# Patient Record
Sex: Male | Born: 1985 | Race: White | Hispanic: No | Marital: Married | Smoking: Former smoker
Health system: Southern US, Community
[De-identification: ages and names within clinical notes are randomized; demographics above are authoritative.]

## PROBLEM LIST (undated history)

## (undated) DIAGNOSIS — Z8489 Family history of other specified conditions: Secondary | ICD-10-CM

## (undated) DIAGNOSIS — T4145XA Adverse effect of unspecified anesthetic, initial encounter: Secondary | ICD-10-CM

## (undated) DIAGNOSIS — R55 Syncope and collapse: Secondary | ICD-10-CM

## (undated) DIAGNOSIS — R569 Unspecified convulsions: Secondary | ICD-10-CM

## (undated) DIAGNOSIS — R51 Headache: Secondary | ICD-10-CM

## (undated) DIAGNOSIS — F988 Other specified behavioral and emotional disorders with onset usually occurring in childhood and adolescence: Secondary | ICD-10-CM

## (undated) DIAGNOSIS — T8859XA Other complications of anesthesia, initial encounter: Secondary | ICD-10-CM

## (undated) DIAGNOSIS — R519 Headache, unspecified: Secondary | ICD-10-CM

## (undated) HISTORY — DX: Other specified behavioral and emotional disorders with onset usually occurring in childhood and adolescence: F98.8

## (undated) HISTORY — PX: HERNIA REPAIR: SHX51

---
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
## 2007-03-09 ENCOUNTER — Ambulatory Visit: Payer: Self-pay | Admitting: Internal Medicine

## 2007-03-09 DIAGNOSIS — L708 Other acne: Secondary | ICD-10-CM | POA: Insufficient documentation

## 2007-03-11 ENCOUNTER — Encounter (INDEPENDENT_AMBULATORY_CARE_PROVIDER_SITE_OTHER): Payer: Self-pay | Admitting: *Deleted

## 2007-03-11 LAB — CONVERTED CEMR LAB
ALT: 18 units/L (ref 0–53)
AST: 21 units/L (ref 0–37)
BUN: 11 mg/dL (ref 6–23)
Basophils Absolute: 0 10*3/uL (ref 0.0–0.1)
Basophils Relative: 0.1 % (ref 0.0–1.0)
CO2: 33 meq/L — ABNORMAL HIGH (ref 19–32)
Calcium: 9.6 mg/dL (ref 8.4–10.5)
Chloride: 104 meq/L (ref 96–112)
Cholesterol: 127 mg/dL (ref 0–200)
Creatinine, Ser: 0.9 mg/dL (ref 0.4–1.5)
Eosinophils Absolute: 0.1 10*3/uL (ref 0.0–0.6)
Eosinophils Relative: 0.9 % (ref 0.0–5.0)
GFR calc Af Amer: 140 mL/min
GFR calc non Af Amer: 116 mL/min
Glucose, Bld: 88 mg/dL (ref 70–99)
HCT: 45.5 % (ref 39.0–52.0)
Hemoglobin: 15.9 g/dL (ref 13.0–17.0)
Lymphocytes Relative: 21.6 % (ref 12.0–46.0)
MCHC: 34.9 g/dL (ref 30.0–36.0)
MCV: 86.6 fL (ref 78.0–100.0)
Monocytes Absolute: 0.6 10*3/uL (ref 0.2–0.7)
Monocytes Relative: 8.7 % (ref 3.0–11.0)
Neutro Abs: 4.6 10*3/uL (ref 1.4–7.7)
Neutrophils Relative %: 68.7 % (ref 43.0–77.0)
Platelets: 208 10*3/uL (ref 150–400)
Potassium: 4.3 meq/L (ref 3.5–5.1)
RBC: 5.25 M/uL (ref 4.22–5.81)
RDW: 12.4 % (ref 11.5–14.6)
Sodium: 142 meq/L (ref 135–145)
TSH: 0.9 microintl units/mL (ref 0.35–5.50)
WBC: 6.7 10*3/uL (ref 4.5–10.5)

## 2007-03-24 ENCOUNTER — Emergency Department (HOSPITAL_COMMUNITY): Admission: EM | Admit: 2007-03-24 | Discharge: 2007-03-24 | Payer: Self-pay | Admitting: Emergency Medicine

## 2008-01-21 ENCOUNTER — Telehealth (INDEPENDENT_AMBULATORY_CARE_PROVIDER_SITE_OTHER): Payer: Self-pay | Admitting: *Deleted

## 2008-01-26 ENCOUNTER — Ambulatory Visit: Payer: Self-pay | Admitting: Internal Medicine

## 2008-11-27 ENCOUNTER — Emergency Department (HOSPITAL_COMMUNITY): Admission: EM | Admit: 2008-11-27 | Discharge: 2008-11-27 | Payer: Self-pay | Admitting: Family Medicine

## 2009-07-07 ENCOUNTER — Emergency Department (HOSPITAL_COMMUNITY): Admission: EM | Admit: 2009-07-07 | Discharge: 2009-07-07 | Payer: Self-pay | Admitting: Emergency Medicine

## 2009-11-13 ENCOUNTER — Emergency Department (HOSPITAL_COMMUNITY): Admission: EM | Admit: 2009-11-13 | Discharge: 2009-11-13 | Payer: Self-pay | Admitting: Family Medicine

## 2009-12-01 ENCOUNTER — Emergency Department (HOSPITAL_COMMUNITY): Admission: EM | Admit: 2009-12-01 | Discharge: 2009-12-01 | Payer: Self-pay | Admitting: Emergency Medicine

## 2010-08-03 LAB — HEPATIC FUNCTION PANEL
AST: 19 U/L (ref 0–37)
Bilirubin, Direct: 0.3 mg/dL (ref 0.0–0.3)
Indirect Bilirubin: 1.4 mg/dL — ABNORMAL HIGH (ref 0.3–0.9)
Total Protein: 7.2 g/dL (ref 6.0–8.3)

## 2010-08-03 LAB — URINALYSIS, ROUTINE W REFLEX MICROSCOPIC
Bilirubin Urine: NEGATIVE
Glucose, UA: NEGATIVE mg/dL
Hgb urine dipstick: NEGATIVE
Ketones, ur: 15 mg/dL — AB
Nitrite: NEGATIVE
Protein, ur: NEGATIVE mg/dL
Specific Gravity, Urine: >1.03 — ABNORMAL HIGH (ref 1.005–1.030)
Urobilinogen, UA: 0.2 mg/dL (ref 0.0–1.0)
pH: 6.5 (ref 5.0–8.0)

## 2010-08-03 LAB — DIFFERENTIAL
Basophils Absolute: 0 10*3/uL (ref 0.0–0.1)
Basophils Relative: 0 % (ref 0–1)
Monocytes Absolute: 0.5 10*3/uL (ref 0.1–1.0)
Neutro Abs: 6.1 10*3/uL (ref 1.7–7.7)

## 2010-08-03 LAB — BASIC METABOLIC PANEL
BUN: 8 mg/dL (ref 6–23)
Calcium: 9.3 mg/dL (ref 8.4–10.5)
Creatinine, Ser: 0.91 mg/dL (ref 0.4–1.5)
GFR calc non Af Amer: >60 mL/min (ref >60)
Glucose, Bld: 82 mg/dL (ref 70–99)
Potassium: 3.9 mEq/L (ref 3.5–5.1)

## 2010-08-03 LAB — CBC
HCT: 43.1 % (ref 39.0–52.0)
MCHC: 34.5 g/dL (ref 30.0–36.0)
Platelets: 173 10*3/uL (ref 150–400)
RDW: 12.8 % (ref 11.5–15.5)
WBC: 8 10*3/uL (ref 4.0–10.5)

## 2010-08-08 LAB — CBC
Hemoglobin: 15.2 g/dL (ref 13.0–17.0)
RBC: 5.1 MIL/uL (ref 4.22–5.81)

## 2010-08-08 LAB — BASIC METABOLIC PANEL
Calcium: 9.4 mg/dL (ref 8.4–10.5)
GFR calc Af Amer: >60 mL/min (ref >60)
GFR calc non Af Amer: >60 mL/min (ref >60)
Potassium: 3.9 mEq/L (ref 3.5–5.1)
Sodium: 137 mEq/L (ref 135–145)

## 2010-08-08 LAB — DIFFERENTIAL
Lymphocytes Relative: 21 % (ref 12–46)
Lymphs Abs: 1.5 10*3/uL (ref 0.7–4.0)
Monocytes Absolute: 0.5 10*3/uL (ref 0.1–1.0)
Monocytes Relative: 7 % (ref 3–12)
Neutro Abs: 4.9 10*3/uL (ref 1.7–7.7)

## 2010-09-03 ENCOUNTER — Encounter: Payer: Self-pay | Admitting: Family Medicine

## 2010-09-17 ENCOUNTER — Emergency Department (HOSPITAL_COMMUNITY): Payer: 59

## 2010-09-17 ENCOUNTER — Emergency Department (HOSPITAL_COMMUNITY)
Admission: EM | Admit: 2010-09-17 | Discharge: 2010-09-17 | Disposition: A | Discharge status: Home / Self Care | Payer: 59 | Attending: Emergency Medicine | Admitting: Emergency Medicine

## 2010-09-17 DIAGNOSIS — F988 Other specified behavioral and emotional disorders with onset usually occurring in childhood and adolescence: Secondary | ICD-10-CM | POA: Insufficient documentation

## 2010-09-17 DIAGNOSIS — W219XXA Striking against or struck by unspecified sports equipment, initial encounter: Secondary | ICD-10-CM | POA: Insufficient documentation

## 2010-09-17 DIAGNOSIS — Y929 Unspecified place or not applicable: Secondary | ICD-10-CM | POA: Insufficient documentation

## 2010-09-17 DIAGNOSIS — Y9364 Activity, baseball: Secondary | ICD-10-CM | POA: Insufficient documentation

## 2010-09-17 DIAGNOSIS — R079 Chest pain, unspecified: Secondary | ICD-10-CM | POA: Insufficient documentation

## 2010-09-17 DIAGNOSIS — S20219A Contusion of unspecified front wall of thorax, initial encounter: Secondary | ICD-10-CM | POA: Insufficient documentation

## 2010-09-17 DIAGNOSIS — Z79899 Other long term (current) drug therapy: Secondary | ICD-10-CM | POA: Insufficient documentation

## 2011-12-27 ENCOUNTER — Emergency Department (HOSPITAL_COMMUNITY)
Admission: EM | Admit: 2011-12-27 | Discharge: 2011-12-27 | Disposition: A | Discharge status: Home / Self Care | Payer: BLUE CROSS/BLUE SHIELD | Source: Home / Self Care | Attending: Emergency Medicine | Admitting: Emergency Medicine

## 2011-12-27 ENCOUNTER — Emergency Department (INDEPENDENT_AMBULATORY_CARE_PROVIDER_SITE_OTHER): Payer: BLUE CROSS/BLUE SHIELD

## 2011-12-27 ENCOUNTER — Encounter (HOSPITAL_COMMUNITY): Payer: Self-pay | Admitting: *Deleted

## 2011-12-27 DIAGNOSIS — J189 Pneumonia, unspecified organism: Secondary | ICD-10-CM

## 2011-12-27 MED ORDER — AZITHROMYCIN 250 MG PO TABS
ORAL_TABLET | ORAL | Status: AC
  Filled 2011-12-27: qty 4

## 2011-12-27 MED ORDER — DOXYCYCLINE HYCLATE 100 MG PO CAPS: 100.0000 mg | ORAL_CAPSULE | Freq: Two times a day (BID) | ORAL | Status: AC

## 2011-12-27 MED ORDER — GUAIFENESIN-CODEINE 100-10 MG/5ML PO SYRP: 5.0000 mL | ORAL_SOLUTION | Freq: Four times a day (QID) | ORAL | Status: AC | PRN

## 2011-12-27 MED ORDER — DOXYCYCLINE HYCLATE 100 MG PO CAPS: 100.0000 mg | ORAL_CAPSULE | Freq: Two times a day (BID) | ORAL | Status: DC

## 2011-12-27 MED ORDER — CEFTRIAXONE SODIUM 1 G IJ SOLR
1.0000 g | Freq: Once | INTRAMUSCULAR | Status: AC
  Administered 2011-12-27: 1 g via INTRAMUSCULAR

## 2011-12-27 MED ORDER — CEFTRIAXONE SODIUM 1 G IJ SOLR
INTRAMUSCULAR | Status: AC
  Filled 2011-12-27: qty 10

## 2011-12-27 MED ORDER — AZITHROMYCIN 250 MG PO TABS
1000.0000 mg | ORAL_TABLET | Freq: Every day | ORAL | Status: DC
  Administered 2011-12-27: 1000 mg via ORAL

## 2011-12-27 MED ORDER — LIDOCAINE HCL (PF) 1 % IJ SOLN
INTRAMUSCULAR | Status: AC
  Filled 2011-12-27: qty 5

## 2011-12-27 NOTE — ED Notes (Signed)
Pt with c/o congestion and cough x one week - generalized bodyaches x 2 days and fever onset yesterday -

## 2011-12-27 NOTE — ED Provider Notes (Addendum)
History     CSN: [id number]  Arrival date & time 12/27/11  1528   First MD Initiated Contact with Patient 12/27/11 1602      No chief complaint on file.   (Consider location/radiation/quality/duration/timing/severity/associated sxs/prior treatment) HPI Comments: Patient presents urgent care this evening complaining of an ongoing raspy cough with generalized body aches including major joints in his lower back. Also describes having initially a sore throat and a runny nose as slowly progressed throughout the days to a sinus congestion, " it even hurts just been head forward", are 26-year-old child has a double ear infection and is being treated for it. The last 2 days have been feeling worse and has been a bit more than a week now he should have been getting better but is actually getting worse. Feel tired and no appetite. Patient denies any abdominal pain, vomiting or diarrhea as. Been taking over-the-counter decongestants. Tylenol. Denies any shortness of breath and admits that he is a chronic and current smoker.  The history is provided by the patient.    Past Medical History  Diagnosis Date  . ADD (attention deficit disorder)     No past surgical history on file.  Family History  Problem Relation Age of Onset  . Diabetes Paternal Grandmother   . Hypertension Paternal Grandmother     History  Substance Use Topics  . Smoking status: Current Everyday Smoker -- 1.0 packs/day  . Smokeless tobacco: Not on file  . Alcohol Use: No      Review of Systems  Constitutional: Positive for fever, chills, activity change and appetite change. Negative for fatigue.  HENT: Positive for congestion, rhinorrhea, postnasal drip and sinus pressure. Negative for ear pain, neck pain and neck stiffness.   Respiratory: Positive for cough. Negative for apnea, chest tightness, shortness of breath and wheezing.   Cardiovascular: Negative for chest pain, palpitations and leg swelling.  Genitourinary:  Negative for frequency and flank pain.  Musculoskeletal: Positive for myalgias and arthralgias.  Skin: Negative for rash.  Neurological: Positive for dizziness.    Allergies  Amoxicillin  Home Medications   Current Outpatient Rx  Name Route Sig Dispense Refill  . AMPHETAMINE-DEXTROAMPHET ER 20 MG PO CP24 Oral Take 20 mg by mouth every morning. Rx#30       BP 120/78  Pulse 105  Temp 101 F (38.3 C) (Oral)  Resp 18  SpO2 100%  Physical Exam  Nursing note and vitals reviewed. Constitutional: He appears well-developed and well-nourished. He is active.  Non-toxic appearance. He does not have a sickly appearance. He appears ill. No distress.  HENT:  Head: Normocephalic.  Nose: Rhinorrhea present.  Mouth/Throat: Uvula is midline and mucous membranes are normal. Posterior oropharyngeal erythema present. No oropharyngeal exudate.  Eyes: Conjunctivae are normal.  Neck: Neck supple. No JVD present.  Pulmonary/Chest: Effort normal. No respiratory distress. He has no wheezes. He has no rales. He exhibits no tenderness.  Lymphadenopathy:    He has no cervical adenopathy.  Neurological: He is alert.  Skin: Skin is warm. No rash noted. No erythema.    ED Course  Procedures (including critical care time)  Labs Reviewed - No data to display No results found.   No diagnosis found.    MDM   Early patchy learnt pneumonia. Patient has been provider with a ceftriaxone injection of her stools of a microwave. Her broad-spectrum an atypical coverage. This has been prescribed a course of doxycycline for 10 days. Otherwise about symptoms that should warrant  further evaluation. Patient had a provider with Dr. San Jetty 72 hours and encouraged to breast and hydrate himself well for the next 72 hours. We also discussed short and long-term consequences of smoking or       Jimmie Molly, MD 12/27/11 1634  Jimmie Molly, MD 12/27/11 [phone]

## 2012-03-19 NOTE — Patient Instructions (Signed)
Kurt Bishop  03/19/2012   Your procedure is scheduled on:  03/24/12  Report to [name].  Call this number if you have problems the morning of surgery: [phone]   Remember:   Do not eat food:After Midnight.  May have clear liquids:until Midnight .  Clear liquids include soda, tea, black coffee, apple or grape juice, broth.  Take these medicines the morning of surgery with A SIP OF WATER: adderall, vyvanse   Do not wear jewelry, make-up or nail polish.  Do not wear lotions, powders, or perfumes. You may wear deodorant.  Do not shave 48 hours prior to surgery. Men may shave face and neck.  Do not bring valuables to the hospital.  Contacts, dentures or bridgework may not be worn into surgery.  Leave suitcase in the car. After surgery it may be brought to your room.  For patients admitted to the hospital, checkout time is 11:00 AM the day of discharge.   Patients discharged the day of surgery will not be allowed to drive home.  Name and phone number of your driver: family  Special Instructions: Shower using CHG 2 nights before surgery and the night before surgery.  If you shower the day of surgery use CHG.  Use special wash - you have one bottle of CHG for all showers.  You should use approximately 1/3 of the bottle for each shower.   Please read over the following fact sheets that you were given: Pain Booklet, MRSA Information, Surgical Site Infection Prevention, Anesthesia Post-op Instructions and Care and Recovery After Surgery   PATIENT INSTRUCTIONS POST-ANESTHESIA  IMMEDIATELY FOLLOWING SURGERY:  Do not drive or operate machinery for the first twenty four hours after surgery.  Do not make any important decisions for twenty four hours after surgery or while taking narcotic pain medications or sedatives.  If you develop intractable nausea and vomiting or a severe headache please notify your doctor immediately.  FOLLOW-UP:  Please make an appointment with your  surgeon as instructed. You do not need to follow up with anesthesia unless specifically instructed to do so.  WOUND CARE INSTRUCTIONS (if applicable):  Keep a dry clean dressing on the anesthesia/puncture wound site if there is drainage.  Once the wound has quit draining you may leave it open to air.  Generally you should leave the bandage intact for twenty four hours unless there is drainage.  If the epidural site drains for more than 36-48 hours please call the anesthesia department.  QUESTIONS?:  Please feel free to call your physician or the hospital operator if you have any questions, and they will be happy to assist you.      Hernia, Surgical Repair A hernia occurs when an internal organ pushes out through a weak spot in the belly (abdominal) wall muscles. Hernias commonly occur in the groin and around the navel. Hernias often can be pushed back into place (reduced). Most hernias tend to get worse over time. Problems occur when abdominal contents get stuck in the opening (incarcerated hernia). The blood supply gets cut off (strangulated hernia). This is an emergency and needs surgery. Otherwise, hernia repair can be an elective procedure. This means you can schedule this at your convenience when an emergency is not present. Because complications can occur, if you decide to repair the hernia, it is best to do it soon. When it becomes an emergency procedure, there is increased risk of complications after surgery. CAUSES   Heavy lifting.  Obesity.  Prolonged coughing.  Straining to move your bowels.  Hernias can also occur through a cut (incision) by a surgeonafter an abdominal operation. HOME CARE INSTRUCTIONS Before the repair:  Bed rest is not required. You may continue your normal activities, but avoid heavy lifting (more than 10 pounds) or straining. Cough gently. If you are a smoker, it is best to stop. Even the best hernia repair can break down with the continual strain of  coughing.  Do not wear anything tight over your hernia. Do not try to keep it in with an outside bandage or truss. These can damage abdominal contents if they are trapped in the hernia sac.  Eat a normal diet. Avoid constipation. Straining over long periods of time to have a bowel movement will increase hernia size. It also can breakdown repairs. If you cannot do this with diet alone, laxatives or stool softeners may be used. PRIOR TO SURGERY, SEEK IMMEDIATE MEDICAL CARE IF: You have problems (symptoms) of a trapped (incarcerated) hernia. Symptoms include:  An oral temperature above 102 F (38.9 C) develops, or as your caregiver suggests.  Increasing abdominal pain.  Feeling sick to your stomach(nausea) and vomiting.  You stop passing gas or stool.  The hernia is stuck outside the abdomen, looks discolored, feels hard, or is tender.  You have any changes in your bowel habits or in the hernia that is unusual for you. LET YOUR CAREGIVERS KNOW ABOUT THE FOLLOWING:  Allergies.  Medications taken including herbs, eye drops, over the counter medications, and creams.  Use of steroids (by mouth or creams).  Family or personal history of problems with anesthetics or Novocaine.  Possibility of pregnancy, if this applies.  Personal history of blood clots (thrombophlebitis).  Family or personal history of bleeding or blood problems.  Previous surgery.  Other health problems. BEFORE THE PROCEDURE You should be present 1 hour prior to your procedure, or as directed by your caregiver.  AFTER THE PROCEDURE After surgery, you will be taken to the recovery area. A nurse will watch and check your progress there. Once you are awake, stable, and taking fluids well, you will be allowed to go home as long as there are no problems. Once home, an ice pack (wrapped in a light towel) applied to your operative site may help with discomfort. It may also keep the swelling down. Do not lift anything  heavier than 10 pounds (4.55 kilograms). Take showers not baths. Do not drive while taking narcotics. Follow instructions as suggested by your caregiver.  SEEK IMMEDIATE MEDICAL CARE IF: After surgery:  There is redness, swelling, or increasing pain in the wound.  There is pus coming from the wound.  There is drainage from a wound lasting longer than 1 day.  An unexplained oral temperature above 102 F (38.9 C) develops.  You notice a foul smell coming from the wound or dressing.  There is a breaking open of a wound (edged not staying together) after the sutures have been removed.  You notice increasing pain in the shoulders (shoulder strap areas).  You develop dizzy episodes or fainting while standing.  You develop persistent nausea or vomiting.  You develop a rash.  You have difficulty breathing.  You develop any reaction or side effects to medications given. MAKE SURE YOU:   Understand these instructions.  Will watch your condition.  Will get help right away if you are not doing well or get worse. Document Released: 10/29/2000 Document Revised: 07/28/2011 Document Reviewed: 09/21/2007 ExitCare Patient  Information 2013 [place], [place].

## 2012-03-22 ENCOUNTER — Encounter (HOSPITAL_COMMUNITY)
Admission: RE | Admit: 2012-03-22 | Discharge: 2012-03-22 | Disposition: A | Discharge status: Home / Self Care | Payer: BC Managed Care – PPO | Source: Ambulatory Visit | Attending: General Surgery | Admitting: General Surgery

## 2012-03-22 ENCOUNTER — Encounter (HOSPITAL_COMMUNITY): Payer: Self-pay | Admitting: Pharmacy Technician

## 2012-03-22 ENCOUNTER — Encounter (HOSPITAL_COMMUNITY): Payer: Self-pay

## 2012-03-22 LAB — BASIC METABOLIC PANEL
BUN: 13 mg/dL (ref 6–23)
Calcium: 9.8 mg/dL (ref 8.4–10.5)
GFR calc non Af Amer: >90 mL/min (ref >90)
Glucose, Bld: 63 mg/dL — ABNORMAL LOW (ref 70–99)
Potassium: 4.3 mEq/L (ref 3.5–5.1)

## 2012-03-22 LAB — CBC
HCT: 45.9 % (ref 39.0–52.0)
Hemoglobin: 16 g/dL (ref 13.0–17.0)
MCH: 29.7 pg (ref 26.0–34.0)
MCHC: 34.9 g/dL (ref 30.0–36.0)
MCV: 85.2 fL (ref 78.0–100.0)

## 2012-03-23 NOTE — H&P (Signed)
  NTS SOAP Note  Vital Signs:  Vitals as of: 03/18/2012: Systolic 110: Diastolic 74: Heart Rate 86: Temp 98.57F: Height 63ft 11in: Weight 134Lbs 0 Ounces: BMI 19  BMI : 18.69 kg/m2  Subjective: This 26 Years 1 Months old Male presents for of pain in the right groin. This is been increasing over the last several weeks. He has noted occasional bulge in this area. No fevers or chills. No signs or symptoms of incarceration or strangulation. No similar symptomatology in the past.  Review of Symptoms:  Constitutional:unremarkable   Head:unremarkable    Eyes:unremarkable   Nose/Mouth/Throat:unremarkable Cardiovascular:  unremarkable   Respiratory:unremarkable       as per history of present illness Genitourinary:unremarkable     Musculoskeletal:unremarkable   Skin:unremarkable Breast:unremarkable   Hematolgic/Lymphatic:unremarkable     Allergic/Immunologic:unremarkable     Past Medical History:  Obtained     Past Medical History  Surgical History: None Medical Problems: ADHD Psychiatric History: ADHD Allergies: no known drug allergies Medications: vyvance   Social History:Obtained  Social History  Preferred Language: English Ethnicity: Not Hispanic / Latino Age: 26 Years 1 Months Alcohol: none Recreational drug(s): none   Smoking Status: Current every day smoker reviewed on 03/19/2012 Started Date: 05/20/2003 Packs per day: 1.00 Functional Status reviewed on mm/dd/yyyy ------------------------------------------------ Bathing: Normal Cooking: Normal Dressing: Normal Driving: Normal Eating: Normal Managing Meds: Normal Oral Care: Normal Shopping: Normal Toileting: Normal Transferring: Normal Walking: Normal Cognitive Status reviewed on mm/dd/yyyy ------------------------------------------------ Attention: Normal Decision Making: Normal Language: Normal Memory: Normal Motor: Normal Perception: Normal Problem  Solving: Normal Visual and Spatial: Normal   Family History:Obtained     Family History  Is there a family history JX:BJYNWGNF mellitus    Objective Information: General:  Well appearing, well nourished in no distress. Skin:     no rash or prominent lesions Head:Atraumatic; no masses; no abnormalities Eyes:  conjunctiva clear, EOM intact, PERRL Mouth:  Mucous membranes moist, no mucosal lesions. Neck:  Supple without lymphadenopathy.  Heart:  RRR, no murmur Lungs:    CTA bilaterally, no wheezes, rhonchi, rales.  Breathing unlabored. Abdomen:Soft, NT/ND, no HSM, no masses.  right angle hernia, reducible Extremities:  No deformities, clubbing, cyanosis, or edema.   Assessment:    Plan: right inguinal hernia. Surgical options were discussed. Signs and symptoms of incarceration and strangulation were also discussed. patient will schedule at his convenience the patient also expressed interest in vasectomy and stated the patient can be set up peripheral with urology following his hernia repair.  Patient Education:Alternative treatments to surgery were discussed with patient (and family).  Risks and benefits  of procedure were fully explained to the patient (and family) who gave informed consent. Patient/family questions were addressed.  Follow-up:Pending Surgery

## 2012-03-24 ENCOUNTER — Encounter (HOSPITAL_COMMUNITY): Payer: Self-pay | Admitting: Anesthesiology

## 2012-03-24 ENCOUNTER — Ambulatory Visit (HOSPITAL_COMMUNITY): Payer: BC Managed Care – PPO | Admitting: Anesthesiology

## 2012-03-24 ENCOUNTER — Encounter (HOSPITAL_COMMUNITY)
Admission: RE | Disposition: A | Discharge status: Home / Self Care | Payer: Self-pay | Source: Ambulatory Visit | Attending: General Surgery

## 2012-03-24 ENCOUNTER — Ambulatory Visit (HOSPITAL_COMMUNITY)
Admission: RE | Admit: 2012-03-24 | Discharge: 2012-03-24 | Disposition: A | Discharge status: Home / Self Care | Payer: BC Managed Care – PPO | Source: Ambulatory Visit | Attending: General Surgery | Admitting: General Surgery

## 2012-03-24 ENCOUNTER — Encounter (HOSPITAL_COMMUNITY): Payer: Self-pay | Admitting: *Deleted

## 2012-03-24 DIAGNOSIS — Z01812 Encounter for preprocedural laboratory examination: Secondary | ICD-10-CM | POA: Insufficient documentation

## 2012-03-24 DIAGNOSIS — K4090 Unilateral inguinal hernia, without obstruction or gangrene, not specified as recurrent: Secondary | ICD-10-CM | POA: Insufficient documentation

## 2012-03-24 HISTORY — PX: INGUINAL HERNIA REPAIR: SHX194

## 2012-03-24 SURGERY — REPAIR, HERNIA, INGUINAL, ADULT
Anesthesia: General | Site: Groin | Laterality: Right | Wound class: Clean

## 2012-03-24 MED ORDER — SODIUM CHLORIDE 0.9 % IR SOLN
Status: DC | PRN
  Administered 2012-03-24: 1000 mL

## 2012-03-24 MED ORDER — CEFAZOLIN SODIUM-DEXTROSE 2-3 GM-% IV SOLR
INTRAVENOUS | Status: AC
  Filled 2012-03-24: qty 50

## 2012-03-24 MED ORDER — PROPOFOL 10 MG/ML IV EMUL
INTRAVENOUS | Status: AC
  Filled 2012-03-24: qty 20

## 2012-03-24 MED ORDER — ONDANSETRON HCL 4 MG/2ML IJ SOLN: 4.0000 mg | Freq: Once | INTRAMUSCULAR | Status: DC | PRN

## 2012-03-24 MED ORDER — CELECOXIB 100 MG PO CAPS: 400.0000 mg | ORAL_CAPSULE | Freq: Every day | ORAL | Status: DC

## 2012-03-24 MED ORDER — CEFAZOLIN SODIUM-DEXTROSE 2-3 GM-% IV SOLR
2.0000 g | Freq: Once | INTRAVENOUS | Status: AC
  Administered 2012-03-24: 2 g via INTRAVENOUS

## 2012-03-24 MED ORDER — LACTATED RINGERS IV SOLN
INTRAVENOUS | Status: DC
  Administered 2012-03-24: 1000 mL via INTRAVENOUS

## 2012-03-24 MED ORDER — ONDANSETRON HCL 4 MG/2ML IJ SOLN
INTRAMUSCULAR | Status: AC
  Filled 2012-03-24: qty 2

## 2012-03-24 MED ORDER — FENTANYL CITRATE 0.05 MG/ML IJ SOLN
INTRAMUSCULAR | Status: AC
  Filled 2012-03-24: qty 5

## 2012-03-24 MED ORDER — FENTANYL CITRATE 0.05 MG/ML IJ SOLN: 25.0000 ug | INTRAMUSCULAR | Status: DC | PRN

## 2012-03-24 MED ORDER — BUPIVACAINE HCL (PF) 0.5 % IJ SOLN
INTRAMUSCULAR | Status: DC | PRN
  Administered 2012-03-24: 10 mL

## 2012-03-24 MED ORDER — HYDROCODONE-ACETAMINOPHEN 5-325 MG PO TABS: 1.0000 | ORAL_TABLET | ORAL | Status: DC | PRN

## 2012-03-24 MED ORDER — MIDAZOLAM HCL 2 MG/2ML IJ SOLN
1.0000 mg | INTRAMUSCULAR | Status: DC | PRN
  Administered 2012-03-24: 2 mg via INTRAVENOUS

## 2012-03-24 MED ORDER — ONDANSETRON HCL 4 MG/2ML IJ SOLN
4.0000 mg | Freq: Once | INTRAMUSCULAR | Status: AC
  Administered 2012-03-24: 4 mg via INTRAVENOUS

## 2012-03-24 MED ORDER — PROPOFOL 10 MG/ML IV BOLUS
INTRAVENOUS | Status: DC | PRN
  Administered 2012-03-24: 100 mg via INTRAVENOUS
  Administered 2012-03-24: 160 mg via INTRAVENOUS

## 2012-03-24 MED ORDER — BUPIVACAINE HCL (PF) 0.5 % IJ SOLN
INTRAMUSCULAR | Status: AC
  Filled 2012-03-24: qty 30

## 2012-03-24 MED ORDER — LIDOCAINE HCL (PF) 1 % IJ SOLN
INTRAMUSCULAR | Status: AC
  Filled 2012-03-24: qty 5

## 2012-03-24 MED ORDER — FENTANYL CITRATE 0.05 MG/ML IJ SOLN
INTRAMUSCULAR | Status: DC | PRN
  Administered 2012-03-24 (×2): 50 ug via INTRAVENOUS
  Administered 2012-03-24 (×3): 25 ug via INTRAVENOUS
  Administered 2012-03-24: 50 ug via INTRAVENOUS
  Administered 2012-03-24: 25 ug via INTRAVENOUS

## 2012-03-24 MED ORDER — LACTATED RINGERS IV SOLN: INTRAVENOUS | Status: DC

## 2012-03-24 MED ORDER — LIDOCAINE HCL (CARDIAC) 20 MG/ML IV SOLN
INTRAVENOUS | Status: DC | PRN
  Administered 2012-03-24: 40 mg via INTRAVENOUS

## 2012-03-24 MED ORDER — MIDAZOLAM HCL 2 MG/2ML IJ SOLN
INTRAMUSCULAR | Status: AC
  Filled 2012-03-24: qty 2

## 2012-03-24 MED ORDER — MIDAZOLAM HCL 5 MG/5ML IJ SOLN
INTRAMUSCULAR | Status: DC | PRN
  Administered 2012-03-24: 2 mg via INTRAVENOUS

## 2012-03-24 SURGICAL SUPPLY — 40 items
APL SKNCLS STERI-STRIP NONHPOA (GAUZE/BANDAGES/DRESSINGS) ×1
BAG HAMPER (MISCELLANEOUS) ×2 IMPLANT
BENZOIN TINCTURE PRP APPL 2/3 (GAUZE/BANDAGES/DRESSINGS) ×2 IMPLANT
CLOTH BEACON ORANGE TIMEOUT ST (SAFETY) ×2 IMPLANT
COVER LIGHT HANDLE STERIS (MISCELLANEOUS) ×4 IMPLANT
DECANTER SPIKE VIAL GLASS SM (MISCELLANEOUS) ×2 IMPLANT
DRAIN PENROSE 18X.75 LTX STRL (MISCELLANEOUS) ×2 IMPLANT
DURAPREP 26ML APPLICATOR (WOUND CARE) ×2 IMPLANT
ELECT REM PT RETURN 9FT ADLT (ELECTROSURGICAL) ×2
ELECTRODE REM PT RTRN 9FT ADLT (ELECTROSURGICAL) ×1 IMPLANT
FORMALIN 10 PREFIL 120ML (MISCELLANEOUS) ×1 IMPLANT
GLOVE BIOGEL PI IND STRL 7.0 (GLOVE) IMPLANT
GLOVE BIOGEL PI IND STRL 7.5 (GLOVE) ×1 IMPLANT
GLOVE BIOGEL PI INDICATOR 7.0 (GLOVE) ×3
GLOVE BIOGEL PI INDICATOR 7.5 (GLOVE) ×1
GLOVE ECLIPSE 6.5 STRL STRAW (GLOVE) ×2 IMPLANT
GLOVE ECLIPSE 7.0 STRL STRAW (GLOVE) ×2 IMPLANT
GOWN STRL REIN XL XLG (GOWN DISPOSABLE) ×7 IMPLANT
INST SET MINOR GENERAL (KITS) ×2 IMPLANT
KIT ROOM TURNOVER APOR (KITS) ×2 IMPLANT
MANIFOLD NEPTUNE II (INSTRUMENTS) ×2 IMPLANT
MESH MARLEX PLUG MEDIUM (Mesh General) ×1 IMPLANT
NDL HYPO 25X1 1.5 SAFETY (NEEDLE) ×1 IMPLANT
NEEDLE HYPO 25X1 1.5 SAFETY (NEEDLE) ×2 IMPLANT
NS IRRIG 1000ML POUR BTL (IV SOLUTION) ×2 IMPLANT
PACK MINOR (CUSTOM PROCEDURE TRAY) ×2 IMPLANT
PAD ARMBOARD 7.5X6 YLW CONV (MISCELLANEOUS) ×2 IMPLANT
SET BASIN LINEN APH (SET/KITS/TRAYS/PACK) ×2 IMPLANT
STRIP CLOSURE SKIN 1/2X4 (GAUZE/BANDAGES/DRESSINGS) ×2 IMPLANT
SUT ETHIBOND NAB MO 7 #0 18IN (SUTURE) ×2 IMPLANT
SUT MNCRL AB 4-0 PS2 18 (SUTURE) ×2 IMPLANT
SUT SILK 2 0 (SUTURE) ×2
SUT SILK 2-0 18XBRD TIE 12 (SUTURE) IMPLANT
SUT VIC AB 2-0 CT1 27 (SUTURE) ×2
SUT VIC AB 2-0 CT1 TAPERPNT 27 (SUTURE) ×1 IMPLANT
SUT VIC AB 3-0 SH 27 (SUTURE) ×2
SUT VIC AB 3-0 SH 27X BRD (SUTURE) ×1 IMPLANT
SUT VICRYL AB 3 0 TIES (SUTURE) IMPLANT
SYR BULB IRRIGATION 50ML (SYRINGE) ×2 IMPLANT
SYR CONTROL 10ML LL (SYRINGE) ×2 IMPLANT

## 2012-03-24 NOTE — Transfer of Care (Signed)
Immediate Anesthesia Transfer of Care Note  Patient: Kurt Bishop  Procedure(s) Performed: Procedure(s) (LRB) with comments: HERNIA REPAIR INGUINAL ADULT (Right)  Patient Location: PACU  Anesthesia Type:General  Level of Consciousness: awake, alert  and oriented  Airway & Oxygen Therapy: Patient Spontanous Breathing and Patient connected to face mask oxygen  Post-op Assessment: Report given to PACU RN  Post vital signs: Reviewed and stable  Complications: No apparent anesthesia complications

## 2012-03-24 NOTE — Anesthesia Preprocedure Evaluation (Signed)
Anesthesia Evaluation  Patient identified by MRN, date of birth, ID band Patient awake    Reviewed: Allergy & Precautions, H&P , NPO status , Patient's Chart, lab work & pertinent test results  Airway Mallampati: II TM Distance: >3 FB Neck ROM: Full    Dental  (+) Teeth Intact   Pulmonary Current Smoker,  breath sounds clear to auscultation        Cardiovascular negative cardio ROS  Rhythm:Regular Rate:Normal     Neuro/Psych PSYCHIATRIC DISORDERS (ADD)    GI/Hepatic negative GI ROS,   Endo/Other    Renal/GU      Musculoskeletal   Abdominal   Peds  Hematology   Anesthesia Other Findings   Reproductive/Obstetrics                           Anesthesia Physical Anesthesia Plan  ASA: II  Anesthesia Plan: General   Post-op Pain Management:    Induction: Intravenous  Airway Management Planned: LMA  Additional Equipment:   Intra-op Plan:   Post-operative Plan: Extubation in OR  Informed Consent: I have reviewed the patients History and Physical, chart, labs and discussed the procedure including the risks, benefits and alternatives for the proposed anesthesia with the patient or authorized representative who has indicated his/her understanding and acceptance.     Plan Discussed with:   Anesthesia Plan Comments:         Anesthesia Quick Evaluation

## 2012-03-24 NOTE — Anesthesia Procedure Notes (Signed)
Procedure Name: LMA Insertion Date/Time: 03/24/2012 8:09 AM Performed by: Glynn Octave E Pre-anesthesia Checklist: Patient identified, Patient being monitored, Emergency Drugs available, Timeout performed and Suction available Patient Re-evaluated:Patient Re-evaluated prior to inductionOxygen Delivery Method: Circle System Utilized Preoxygenation: Pre-oxygenation with 100% oxygen Intubation Type: IV induction Ventilation: Mask ventilation without difficulty LMA: LMA inserted LMA Size: 4.0 Number of attempts: 1 Placement Confirmation: positive ETCO2 and breath sounds checked- equal and bilateral Tube secured with: Tape

## 2012-03-24 NOTE — Anesthesia Postprocedure Evaluation (Signed)
  Anesthesia Post-op Note  Patient: Kurt Bishop  Procedure(s) Performed: Procedure(s) (LRB) with comments: HERNIA REPAIR INGUINAL ADULT (Right)  Patient Location: PACU  Anesthesia Type:General  Level of Consciousness: awake, alert  and oriented  Airway and Oxygen Therapy: Patient Spontanous Breathing and Patient connected to face mask oxygen  Post-op Pain: mild  Post-op Assessment: Post-op Vital signs reviewed, Patient's Cardiovascular Status Stable, Respiratory Function Stable, Patent Airway and No signs of Nausea or vomiting  Post-op Vital Signs: Reviewed and stable  Complications: No apparent anesthesia complications

## 2012-03-24 NOTE — Interval H&P Note (Signed)
History and Physical Interval Note:  03/24/2012 7:48 AM  Kurt Bishop  has presented today for surgery, with the diagnosis of Right Inguinal Hernia  The various methods of treatment have been discussed with the patient and family. After consideration of risks, benefits and other options for treatment, the patient has consented to  Procedure(s) (LRB) with comments: HERNIA REPAIR INGUINAL ADULT (Right) as a surgical intervention .  The patient's history has been reviewed, patient examined, no change in status, stable for surgery.  I have reviewed the patient's chart and labs.  Questions were answered to the patient's satisfaction.     Marisela Line C

## 2012-03-24 NOTE — Op Note (Signed)
Patient:  Kurt Bishop  DOB:  Dec 23, 1985  MRN:  [id number]   Preop Diagnosis:  Right angle hernia  Postop Diagnosis:  The same (direct)  Procedure:  Right angle hernia repair with mesh  Surgeon:  Dr. Tilford Pillar  Anes:  General via laryngeal mask airway, 0.5% Sensorcaine plain for local  Indications:  Patient is a 26 year old male presented to my office with a history of right inguinal discomfort and bulging. Workup and evaluation was consistent for a right inguinal hernia. Risks benefits alternatives of a right inguinal hernia repair with mesh were discussed at length the patient including but not limited to risk of bleeding, infection, infection and mesh requiring removal and subsequent repair, paresthesias, ischemic orchiditis, testicular loss, chronic pain. Patient's questions and concerns were addressed the patient as consented for the planned procedure.  Procedure note:  Patient is taken to the operating room was placed in the supine position the or table time the general anesthetic is a Optician, dispensing. Once patient was asleep he had a larger mask airway placed by the nurse anesthetist. At this point his right groin abdominal wall are prepped with DuraPrep solution and draped in standard fashion. A marking pen was utilized to [place] the planned incision. A 10 blade scalpel was utilized to create the initial skin incision. Additional dissection down to subcuticular tissues carried out using electric cautery. The external oblique fascia was encountered. This was scored with a 15 blade scalpel. Open medially with Metzenbaum scissors and further opened laterally with Metzenbaum scissors. The medial dissection was carried out to the external inguinal ring. At this time careful dissection was carried out to free the cord structures from the inguinal canal. A window was created behind the cord structures over the pubic tubercle with blunt digital dissection. A Penrose drain was placed to help with  retraction of the cord structures. Careful inspection of the cord structures indicated no evidence of it indirect hernia sac. There was weakness noted within the floor of the inguinal canal consistent with a direct hernia. A medium mesh plug was then inserted and secured to the walls of the floor of the inguinal canal. A mesh onlay was then brought to the field was pexed medially over the pubic tubercle, inferiorly to the shelving portion inguinal ligament, superiorly to the conjoined tendon, and laterally the keyhole defect was secured around the cord structures. All sutures were 0 Ethibond suture placed in simple interrupted fashion. The tail of the mesh was then tucked under the external oblique fascia. As quite pleased with the repair. The cord structures returned to the canal. Hemostasis is excellent. The wound is irrigated. The external oblique fascia was reapproximated with a 2-0 Vicryl suture. The local anesthetic was instilled. A 3-0 Vicryl suture was then utilized reapproximate the Scarpa's fascia. The skin edges were reapproximated using a 4-0 Monocryl in a running subcuticular suture. Skin was washed dried moist dry towel. Benzoin is applied around incision. Half-inch are suture placed. The drapes removed patient left come out of general anesthetic and stretcher back in stable condition. At the conclusion of procedure all instrument, sponge, needle counts are correct. Patient tolerated procedure extremely well.  Complications:  None apparent  EBL:  Minimal  Specimen:  None

## 2012-03-26 ENCOUNTER — Encounter (HOSPITAL_COMMUNITY): Payer: Self-pay | Admitting: General Surgery

## 2012-08-16 ENCOUNTER — Telehealth: Payer: Self-pay | Admitting: Family Medicine

## 2012-08-16 MED ORDER — AMPHETAMINE-DEXTROAMPHET ER 30 MG PO CP24: 30.0000 mg | ORAL_CAPSULE | ORAL | Status: DC

## 2012-08-16 NOTE — Telephone Encounter (Signed)
Can come pick up but he needs ov before next refill.

## 2012-08-16 NOTE — Telephone Encounter (Signed)
pts wife aware.

## 2012-08-16 NOTE — Telephone Encounter (Signed)
LOV 03/08/12 Last refill 07/15/12 Need rx if ok to refill

## 2012-08-28 ENCOUNTER — Encounter: Payer: Self-pay | Admitting: Family Medicine

## 2012-08-28 DIAGNOSIS — F988 Other specified behavioral and emotional disorders with onset usually occurring in childhood and adolescence: Secondary | ICD-10-CM | POA: Insufficient documentation

## 2012-09-09 ENCOUNTER — Ambulatory Visit: Payer: Self-pay | Admitting: Family Medicine

## 2012-09-22 ENCOUNTER — Ambulatory Visit (INDEPENDENT_AMBULATORY_CARE_PROVIDER_SITE_OTHER): Payer: BC Managed Care – PPO | Admitting: Family Medicine

## 2012-09-22 ENCOUNTER — Encounter: Payer: Self-pay | Admitting: Family Medicine

## 2012-09-22 VITALS — BP 90/60 | HR 76 | Temp 98.6°F | Resp 16 | Wt 139.0 lb

## 2012-09-22 DIAGNOSIS — F909 Attention-deficit hyperactivity disorder, unspecified type: Secondary | ICD-10-CM

## 2012-09-22 MED ORDER — LISDEXAMFETAMINE DIMESYLATE 60 MG PO CAPS: 60.0000 mg | ORAL_CAPSULE | ORAL | Status: DC

## 2012-09-22 NOTE — Progress Notes (Signed)
Subjective:    Patient ID: Kurt Bishop, male    DOB: [birth year], 27 y.o.   MRN: [id number]  HPI  Patient is here for followup of his ADHD. He is currently taking Adderall XR 30 mg by mouth daily. Have his medicine is wearing off too quickly. He previously was having great success on vyvanse 60 mg a day. However, we discontinued that medicine due to cost. He would like to resume the medicine. He thought the medicine did well off as abruptly.  Furthermore, the vyvanse seem to last longer throughout the days that he can get the job done and complete his task without difficulties at work.  The patient is having a lot of marital stress. He and his wife are both here. They have a 71-month-old child is not sleeping through the night. He is now provider of his family and is under financial strain. His wife is coming in school. They are doing more. She has left him several times. She states that his anger has gotten worse. He tends to fly off the handle quickly. He is also making more frequent comments. Past Medical History  Diagnosis Date  . ADD (attention deficit disorder)    No current outpatient prescriptions on file prior to visit.   No current facility-administered medications on file prior to visit.   Allergies  Allergen Reactions  . Amoxicillin Other (See Comments)    When its hot it causes rash    History   Social History  . Marital Status: Married    Spouse Name: N/A    Number of Children: N/A  . Years of Education: N/A   Occupational History  . Not on file.   Social History Main Topics  . Smoking status: Current Every Day Smoker -- 1.00 packs/day for 8 years    Types: Cigarettes  . Smokeless tobacco: Not on file  . Alcohol Use: No  . Drug Use: No  . Sexually Active: Not on file   Other Topics Concern  . Not on file   Social History Narrative  . No narrative on file   Family History  Problem Relation Age of Onset  . Diabetes Paternal Grandmother   .  Hypertension Paternal Grandmother       Review of Systems  All other systems reviewed and are negative.       Objective:   Physical Exam  Constitutional: He is oriented to person, place, and time. He appears well-developed and well-nourished.  Cardiovascular: Normal rate, regular rhythm and normal heart sounds.  Exam reveals no gallop and no friction rub.   No murmur heard. Pulmonary/Chest: Effort normal and breath sounds normal. No respiratory distress. He has no wheezes. He has no rales.  Neurological: He is alert and oriented to person, place, and time. No cranial nerve deficit. He exhibits normal muscle tone. Coordination normal.  Psychiatric: He has a normal mood and affect. His behavior is normal. Judgment and thought content normal.          Assessment & Plan:  1. ADHD (attention deficit hyperactivity disorder) Discontinue Adderall XR. Begin vyvanse 60 mg poqday.  I gave the patient is dated prescriptions for May, June, in July. We'll see him back in 6 months, sooner if worse.  Spelled over 20 minutes talking with the patient and his wife. They have a lot of stress with a new baby and work. I stated that this is normal. Stated that they needed to be committed to one another to support each  other and the hard times. Right now the stress seems to be tearing them apart. Offered counseling if they're interested. They stated they would like to work on the relationship privately first.

## 2012-12-09 ENCOUNTER — Encounter: Payer: Self-pay | Admitting: Family Medicine

## 2012-12-09 ENCOUNTER — Ambulatory Visit (INDEPENDENT_AMBULATORY_CARE_PROVIDER_SITE_OTHER): Payer: BC Managed Care – PPO | Admitting: Family Medicine

## 2012-12-09 VITALS — BP 112/74 | HR 100 | Temp 98.4°F | Resp 14 | Wt 138.0 lb

## 2012-12-09 DIAGNOSIS — J019 Acute sinusitis, unspecified: Secondary | ICD-10-CM

## 2012-12-09 MED ORDER — PREDNISONE 20 MG PO TABS: ORAL_TABLET | ORAL | Status: DC

## 2012-12-09 MED ORDER — AMPHETAMINE-DEXTROAMPHET ER 30 MG PO CP24: 30.0000 mg | ORAL_CAPSULE | ORAL | Status: DC

## 2012-12-09 MED ORDER — AZITHROMYCIN 250 MG PO TABS: ORAL_TABLET | ORAL | Status: DC

## 2012-12-09 NOTE — Progress Notes (Signed)
  Subjective:    Patient ID: Kurt Bishop, male    DOB: [birth year], 27 y.o.   MRN: [id number]  HPI Patient presents with 2 weeks of pain and pressure in both maxillary sinuses. He reports rhinorrhea severe congestion postnasal drip and hoarseness. He's having daily sinus headaches and subjective fevers. He feels terrible. He is tried Mucinex and Sudafed without relief. His wife is also concerned because of his mood swings. He seems to lose his temper quickly. He has no history of mania. He denies depression. He blames it on stress at work. Past Medical History  Diagnosis Date  . ADD (attention deficit disorder)    No current outpatient prescriptions on file prior to visit.   No current facility-administered medications on file prior to visit.   Allergies  Allergen Reactions  . Amoxicillin Other (See Comments)    When its hot it causes rash    History   Social History  . Marital Status: Married    Spouse Name: N/A    Number of Children: N/A  . Years of Education: N/A   Occupational History  . Not on file.   Social History Main Topics  . Smoking status: Former Smoker    Types: Cigarettes  . Smokeless tobacco: Former Neurosurgeon    Quit date: 06/18/2012  . Alcohol Use: No  . Drug Use: No  . Sexually Active: Not on file   Other Topics Concern  . Not on file   Social History Narrative  . No narrative on file   Family History  Problem Relation Age of Onset  . Diabetes Paternal Grandmother   . Hypertension Paternal Grandmother       Review of Systems  All other systems reviewed and are negative.       Objective:   Physical Exam  Constitutional: He appears well-developed and well-nourished.  HENT:  Head: Normocephalic and atraumatic.  Right Ear: External ear normal.  Left Ear: External ear normal.  Nose: Mucosal edema and rhinorrhea present. Right sinus exhibits maxillary sinus tenderness. Left sinus exhibits maxillary sinus tenderness.  Mouth/Throat:  Oropharynx is clear and moist. No oropharyngeal exudate.  Eyes: Conjunctivae are normal. Pupils are equal, round, and reactive to light.  Neck: Neck supple. No thyromegaly present.  Cardiovascular: Normal rate, regular rhythm and normal heart sounds.   No murmur heard. Pulmonary/Chest: Effort normal and breath sounds normal. No respiratory distress. He has no wheezes. He has no rales.  Lymphadenopathy:    He has no cervical adenopathy.          Assessment & Plan:  1. Acute rhinosinusitis - azithromycin (ZITHROMAX) 250 MG tablet; 2 tabs poqday 1, 1 tab poqday 2-5  Dispense: 6 tablet; Refill: 0 - predniSONE (DELTASONE) 20 MG tablet; 3 tabs poqday 1-2, 2 tabs poqday 3-4, 1 tab poqday 5-6  Dispense: 12 tablet; Refill: 0 The wife is wondering if the patient could be bipolar.  I do not feel the patient is bipolar. However I did recommend the family see a psychologist for couples counseling. This is the second time he has had issues with anger management.  I recommended he seek help to control his temper.

## 2013-02-09 ENCOUNTER — Telehealth: Payer: Self-pay | Admitting: Family Medicine

## 2013-02-09 NOTE — Telephone Encounter (Signed)
?   OK to Refill  

## 2013-02-10 MED ORDER — AMPHETAMINE-DEXTROAMPHET ER 30 MG PO CP24: 30.0000 mg | ORAL_CAPSULE | ORAL | Status: DC

## 2013-02-10 NOTE — Telephone Encounter (Signed)
ok 

## 2013-02-10 NOTE — Telephone Encounter (Signed)
RX printed, left up front and patient aware to pick up per vm 

## 2013-03-08 ENCOUNTER — Ambulatory Visit (INDEPENDENT_AMBULATORY_CARE_PROVIDER_SITE_OTHER): Payer: BC Managed Care – PPO | Admitting: Family Medicine

## 2013-03-08 ENCOUNTER — Encounter: Payer: Self-pay | Admitting: Family Medicine

## 2013-03-08 VITALS — BP 108/74 | HR 80 | Temp 98.1°F | Resp 18 | Ht 71.0 in | Wt 136.0 lb

## 2013-03-08 DIAGNOSIS — F411 Generalized anxiety disorder: Secondary | ICD-10-CM

## 2013-03-08 DIAGNOSIS — F909 Attention-deficit hyperactivity disorder, unspecified type: Secondary | ICD-10-CM

## 2013-03-08 DIAGNOSIS — L299 Pruritus, unspecified: Secondary | ICD-10-CM

## 2013-03-08 LAB — CBC WITH DIFFERENTIAL/PLATELET
Basophils Absolute: 0 10*3/uL (ref 0.0–0.1)
Basophils Relative: 0 % (ref 0–1)
Eosinophils Absolute: 0.2 10*3/uL (ref 0.0–0.7)
Eosinophils Relative: 4 % (ref 0–5)
Lymphs Abs: 1.8 10*3/uL (ref 0.7–4.0)
MCH: 29.8 pg (ref 26.0–34.0)
MCHC: 35.6 g/dL (ref 30.0–36.0)
MCV: 83.7 fL (ref 78.0–100.0)
Neutrophils Relative %: 54 % (ref 43–77)
Platelets: 225 10*3/uL (ref 150–400)
RDW: 13.5 % (ref 11.5–15.5)

## 2013-03-08 LAB — COMPLETE METABOLIC PANEL WITH GFR
ALT: 13 U/L (ref 0–53)
Alkaline Phosphatase: 42 U/L (ref 39–117)
CO2: 29 mEq/L (ref 19–32)
Creat: 0.98 mg/dL (ref 0.50–1.35)
GFR, Est African American: >89 mL/min
Total Bilirubin: 1.5 mg/dL — ABNORMAL HIGH (ref 0.3–1.2)

## 2013-03-08 MED ORDER — BUPROPION HCL ER (XL) 150 MG PO TB24: 300.0000 mg | ORAL_TABLET | Freq: Every day | ORAL | Status: DC

## 2013-03-08 MED ORDER — PERMETHRIN 5 % EX CREA: TOPICAL_CREAM | Freq: Once | CUTANEOUS | Status: DC

## 2013-03-08 MED ORDER — AMPHETAMINE-DEXTROAMPHET ER 30 MG PO CP24: 30.0000 mg | ORAL_CAPSULE | ORAL | Status: DC

## 2013-03-08 NOTE — Progress Notes (Signed)
  Subjective:    Patient ID: Kurt Bishop, male    DOB: 04-01-1986, 26 y.o.   MRN: [id number]  HPI Patient is here for followup of his ADD. He is currently on Adderall XR 30 mg by mouth daily. We switched back to this because he could not afford vyvanse.  The medication is working well. He is able to maintain focus for 13 hours at work. He denies insomnia or appetite suppression. He continues to have a short temper. He continues to have problems with mood swings and anxiety. He is interested in trying medication to help calm his temper and regulate his mood. His wife is pregnant and they have been fighting a lot and he believes that his attitude is problem. Discussed this at his last visit. Did not seek counseling is recommended. The patient also complains of diffuse itching without a rash.  His concern about possible scabies exposure. Past Medical History  Diagnosis Date  . ADD (attention deficit disorder)    No current outpatient prescriptions on file prior to visit.   No current facility-administered medications on file prior to visit.   Allergies  Allergen Reactions  . Amoxicillin Other (See Comments)    When its hot it causes rash    History   Social History  . Marital Status: Married    Spouse Name: N/A    Number of Children: N/A  . Years of Education: N/A   Occupational History  . Not on file.   Social History Main Topics  . Smoking status: Former Smoker    Types: Cigarettes  . Smokeless tobacco: Former Neurosurgeon    Quit date: 06/18/2012  . Alcohol Use: No  . Drug Use: No  . Sexual Activity: Not on file   Other Topics Concern  . Not on file   Social History Narrative  . No narrative on file      Review of Systems  All other systems reviewed and are negative.       Objective:   Physical Exam  Vitals reviewed. Cardiovascular: Normal rate and regular rhythm.   Pulmonary/Chest: Effort normal and breath sounds normal.  Skin: Skin is warm. No rash noted.  No erythema. No pallor.  Psychiatric: He has a normal mood and affect. His behavior is normal. Judgment and thought content normal.          Assessment & Plan:  1. Pruritus I will treat the patient empirically for scabies with Elimite cream. Apply head to toe and rinse off after 8 hours. I will also check a CBC and CMP. - permethrin (ELIMITE) 5 % cream; Apply topically once.  Dispense: 60 g; Refill: 0 - CBC with Differential - COMPLETE METABOLIC PANEL WITH GFR  2. GAD (generalized anxiety disorder) A believe the patient's situational stress, stress at work, and underlying anxiety and anger issues are causing a negative effect on his family and warrant treatment. We will try Wellbutrin.  He is to increase from 150 mg by mouth every morning 300 mg by mouth every morning after 2 weeks. Recheck in 4-6 weeks - buPROPion (WELLBUTRIN XL) 150 MG 24 hr tablet; Take 2 tablets (300 mg total) by mouth daily.  Dispense: 60 tablet; Refill: 5  3. ADHD (attention deficit hyperactivity disorder) I refill the patient's Adderall XR 30 mg tablets by mouth every morning. I gave him a prescription for the next 3 months. Recheck in 6 months.

## 2013-03-28 ENCOUNTER — Telehealth: Payer: Self-pay | Admitting: Family Medicine

## 2013-03-28 NOTE — Telephone Encounter (Signed)
Patient was seen in October for medicine refill. At that time patient was checked for a itchy rash . Blood work was done . Results came back normal . Patient is still having some problems. The itching has gotten worse , he wakes up in the middle of the night scratching .

## 2013-03-29 NOTE — Telephone Encounter (Signed)
Pt NTBS to evaluate rash. Left message on VM to call office back to set up that appt.

## 2013-04-12 ENCOUNTER — Ambulatory Visit (INDEPENDENT_AMBULATORY_CARE_PROVIDER_SITE_OTHER): Payer: BC Managed Care – PPO | Admitting: Family Medicine

## 2013-04-12 ENCOUNTER — Encounter: Payer: Self-pay | Admitting: Family Medicine

## 2013-04-12 VITALS — BP 90/60 | HR 72 | Temp 97.1°F | Resp 18 | Wt 140.0 lb

## 2013-04-12 DIAGNOSIS — L299 Pruritus, unspecified: Secondary | ICD-10-CM

## 2013-04-12 MED ORDER — COLESTIPOL HCL 1 G PO TABS: 2.0000 g | ORAL_TABLET | Freq: Two times a day (BID) | ORAL | Status: DC

## 2013-04-12 NOTE — Progress Notes (Signed)
Subjective:    Patient ID: Kurt Bishop, male    DOB: August 26, 1985, 27 y.o.   MRN: [id number]  HPI 03/08/13 Patient is here for followup of his ADD. He is currently on Adderall XR 30 mg by mouth daily. We switched back to this because he could not afford vyvanse.  The medication is working well. He is able to maintain focus for 13 hours at work. He denies insomnia or appetite suppression. He continues to have a short temper. He continues to have problems with mood swings and anxiety. He is interested in trying medication to help calm his temper and regulate his mood. His wife is pregnant and they have been fighting a lot and he believes that his attitude is problem. Discussed this at his last visit. Did not seek counseling is recommended. The patient also complains of diffuse itching without a rash.  His concern about possible scabies exposure.  At that time, my plan was:   1. Pruritus I will treat the patient empirically for scabies with Elimite cream. Apply head to toe and rinse off after 8 hours. I will also check a CBC and CMP. - permethrin (ELIMITE) 5 % cream; Apply topically once.  Dispense: 60 g; Refill: 0 - CBC with Differential - COMPLETE METABOLIC PANEL WITH GFR  2. GAD (generalized anxiety disorder) A believe the patient's situational stress, stress at work, and underlying anxiety and anger issues are causing a negative effect on his family and warrant treatment. We will try Wellbutrin.  He is to increase from 150 mg by mouth every morning 300 mg by mouth every morning after 2 weeks. Recheck in 4-6 weeks - buPROPion (WELLBUTRIN XL) 150 MG 24 hr tablet; Take 2 tablets (300 mg total) by mouth daily.  Dispense: 60 tablet; Refill: 5  3. ADHD (attention deficit hyperactivity disorder) I refill the patient's Adderall XR 30 mg tablets by mouth every morning. I gave him a prescription for the next 3 months. Recheck in 6 months.   The patient tried Elimite cream twice without relief. He  reports severe daily itching. There is no rash. Itching is widespread. He has no close contacts with any kind of itchy rash. Examination of the skin today reveals no rash or other lesions on the skin to explain the itching. At his last visit I did check a CBC and CMP. CMP was significant for a bilirubin of 1.5 which was slightly high. 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 Today he is not jaundice. There is no scleral icterus. He denies any abdominal pain. He denies any emotional triggers and itching. He denies any environmental exposures that seem to trigger the itching.  Past Medical History  Diagnosis Date  . ADD (attention deficit disorder)    Current Outpatient Prescriptions on File Prior to Visit  Medication Sig Dispense Refill  . amphetamine-dextroamphetamine (ADDERALL XR) 30 MG 24 hr capsule Take 1 capsule (30 mg total) by mouth every morning.  30 capsule  0  . buPROPion (WELLBUTRIN XL) 150 MG 24 hr tablet Take 2 tablets (300 mg total) by mouth daily.  60 tablet  5   No current facility-administered medications on file prior to visit.   Allergies  Allergen Reactions  . Amoxicillin Other (See Comments)    When its hot it causes rash    History   Social History  . Marital Status: Married    Spouse Name: N/A    Number of Children: N/A  .  Years of Education: N/A   Occupational History  . Not on file.   Social History Main Topics  . Smoking status: Former Smoker    Types: Cigarettes  . Smokeless tobacco: Former Neurosurgeon    Quit date: 06/18/2012  . Alcohol Use: No  . Drug Use: No  . Sexual Activity: Not on file   Other Topics Concern  . Not on file   Social History Narrative  . No narrative on file      Review of Systems  All other systems reviewed and are negative.       Objective:   Physical Exam  Vitals reviewed. Cardiovascular: Normal rate and regular rhythm.   Pulmonary/Chest: Effort normal and breath sounds normal.   Skin: Skin is warm. No rash noted. No erythema. No pallor.  Psychiatric: He has a normal mood and affect. His behavior is normal. Judgment and thought content normal.          Assessment & Plan:  Itching - Plan: COMPLETE METABOLIC PANEL WITH GFR  Pruritus  I will check a CMP.  If bilirubin is higher, I would be concerned about cholestasis causing his itching, and I would start the patient on colestipol 2 g by mouth twice a day and consult hepatology.  If bilirubin is stable or normall, I would begin pursuing environmental exposures as a possible cause of his itching and try the patient on Atarax. If this does not help we could either seek a dermatology consultation or possibly treat neuropathic sources of his itching.

## 2013-04-13 LAB — COMPLETE METABOLIC PANEL WITH GFR
ALT: 16 U/L (ref 0–53)
AST: 20 U/L (ref 0–37)
Albumin: 4.6 g/dL (ref 3.5–5.2)
BUN: 15 mg/dL (ref 6–23)
CO2: 27 mEq/L (ref 19–32)
Calcium: 9.4 mg/dL (ref 8.4–10.5)
Chloride: 102 mEq/L (ref 96–112)
Potassium: 4.1 mEq/L (ref 3.5–5.3)

## 2013-04-15 ENCOUNTER — Other Ambulatory Visit: Payer: Self-pay | Admitting: Family Medicine

## 2013-04-15 DIAGNOSIS — L752 Apocrine miliaria: Secondary | ICD-10-CM

## 2013-04-15 MED ORDER — HYDROXYZINE HCL 25 MG PO TABS: 25.0000 mg | ORAL_TABLET | Freq: Three times a day (TID) | ORAL | Status: DC | PRN

## 2013-06-01 ENCOUNTER — Telehealth: Payer: Self-pay | Admitting: Family Medicine

## 2013-06-01 MED ORDER — OSELTAMIVIR PHOSPHATE 75 MG PO CAPS: 75.0000 mg | ORAL_CAPSULE | Freq: Every day | ORAL | Status: DC

## 2013-06-01 NOTE — Telephone Encounter (Signed)
Pt's 2 children and wife are positive for the flu and was wanting to know if we could call in Tamiflu for him as preventive.  Per Dr. Jeanice Limurham ok, med sent to pharm and pt's wife aware.

## 2013-06-07 ENCOUNTER — Telehealth: Payer: Self-pay | Admitting: Family Medicine

## 2013-06-07 DIAGNOSIS — F988 Other specified behavioral and emotional disorders with onset usually occurring in childhood and adolescence: Secondary | ICD-10-CM

## 2013-06-07 MED ORDER — AMPHETAMINE-DEXTROAMPHET ER 30 MG PO CP24: 30.0000 mg | ORAL_CAPSULE | ORAL | Status: DC

## 2013-06-07 NOTE — Telephone Encounter (Signed)
Refills printed for provider and pt aware able to pick up

## 2013-06-07 NOTE — Telephone Encounter (Signed)
Need refill Adderall.  Last RF/OV 10/21 refilled for three months.  OK refill?

## 2013-06-07 NOTE — Telephone Encounter (Signed)
ok 

## 2013-07-25 ENCOUNTER — Encounter: Payer: Self-pay | Admitting: Family Medicine

## 2013-08-17 ENCOUNTER — Ambulatory Visit (INDEPENDENT_AMBULATORY_CARE_PROVIDER_SITE_OTHER): Payer: BC Managed Care – PPO | Admitting: Family Medicine

## 2013-08-17 ENCOUNTER — Encounter: Payer: Self-pay | Admitting: Family Medicine

## 2013-08-17 VITALS — BP 118/80 | HR 76 | Temp 97.5°F | Resp 16 | Ht 70.5 in | Wt 135.0 lb

## 2013-08-17 DIAGNOSIS — F988 Other specified behavioral and emotional disorders with onset usually occurring in childhood and adolescence: Secondary | ICD-10-CM

## 2013-08-17 MED ORDER — SCOPOLAMINE 1 MG/3DAYS TD PT72: 1.0000 | MEDICATED_PATCH | TRANSDERMAL | Status: DC

## 2013-08-17 MED ORDER — AMPHETAMINE-DEXTROAMPHET ER 30 MG PO CP24: 30.0000 mg | ORAL_CAPSULE | ORAL | Status: DC

## 2013-08-17 NOTE — Progress Notes (Signed)
   Subjective:    Patient ID: Kurt Bishop, male    DOB: 14-Jul-1985, 28 y.o.   MRN: [id number]  HPI Patient is currently taking Adderall XR 30 mg by mouth daily for ADHD. This helps him perform his job adequately. He is able to focus at work and maintain his attention. He denies any insomnia or palpitations. He eats well. He denies any anxiety. He denies any chest pain or shortness of breath. Without the medication he is unable to complete his task at work. He is unable to maintain focus.    Patient is also starting to develop itching again. There is no visible rash. Itching has when he is high, or when he exercises, and when he gets emotionally upset. After the patient empirically for scabies. I tried numerous stones and cortisone creams without benefit. I checked labs to rule out kidney liver or blood problems. Pending consult to dermatology. Past Medical History  Diagnosis Date  . ADD (attention deficit disorder)    No current outpatient prescriptions on file prior to visit.   No current facility-administered medications on file prior to visit.   Allergies  Allergen Reactions  . Amoxicillin Other (See Comments)    When its hot it causes rash    History   Social History  . Marital Status: Married    Spouse Name: N/A    Number of Children: N/A  . Years of Education: N/A   Occupational History  . Not on file.   Social History Main Topics  . Smoking status: Former Smoker    Types: Cigarettes  . Smokeless tobacco: Former NeurosurgeonUser    Quit date: 06/18/2012  . Alcohol Use: No  . Drug Use: No  . Sexual Activity: Not on file   Other Topics Concern  . Not on file   Social History Narrative  . No narrative on file      Review of Systems  All other systems reviewed and are negative.       Objective:   Physical Exam  Vitals reviewed. Constitutional: He appears well-developed and well-nourished.  Eyes: Conjunctivae are normal.  Neck: Neck supple. No thyromegaly  present.  Cardiovascular: Normal rate, regular rhythm and normal heart sounds.   Pulmonary/Chest: Effort normal and breath sounds normal. No respiratory distress. He has no wheezes. He has no rales.  Lymphadenopathy:    He has no cervical adenopathy.  Skin: Skin is warm. No rash noted. No erythema. No pallor.   there is no visible rash.        Assessment & Plan:  1. ADD (attention deficit disorder) Continue Adderall XR 30 mg by mouth every morning. I gave the patient 30 tablets and 2 refills. Recheck in 6 months.  Regarding his itching, association with heat, exercise, or stress makes me question if there is a possibility of cholinergic urticaria. The patient has failed Atarax. Prominent patient on scopolamine patches due to the anti-cholinergic effects and see if this will help with the itching. He can continue to use Atarax as needed for itching. - amphetamine-dextroamphetamine (ADDERALL XR) 30 MG 24 hr capsule; Take 1 capsule (30 mg total) by mouth every morning.  Dispense: 30 capsule; Refill: 0

## 2013-10-18 ENCOUNTER — Telehealth: Payer: Self-pay | Admitting: Family Medicine

## 2013-10-18 NOTE — Telephone Encounter (Signed)
Can we get a copy of the drug screen.

## 2013-10-18 NOTE — Telephone Encounter (Signed)
This company has done a pre employment drug screen.  Shows positive for Amphetamines.  The cutoff is 1000, but for the drug he was positive for the cut off is 500.  His level was greater then 4000.  Based on his prescription and this lab result, is it acceptable??  They need to know before they can hire him.  Lab report say Amphetamines GC/MS conf  Greater then 4000.

## 2013-10-24 NOTE — Telephone Encounter (Signed)
Have tried to call this number all week.  Have not been able to get thru.

## 2013-12-05 ENCOUNTER — Encounter: Payer: Self-pay | Admitting: Family Medicine

## 2013-12-05 ENCOUNTER — Ambulatory Visit (INDEPENDENT_AMBULATORY_CARE_PROVIDER_SITE_OTHER): Payer: BC Managed Care – PPO | Admitting: Family Medicine

## 2013-12-05 VITALS — BP 100/64 | HR 94 | Temp 97.8°F | Resp 14 | Ht 70.5 in | Wt 135.0 lb

## 2013-12-05 DIAGNOSIS — R000 Tachycardia, unspecified: Secondary | ICD-10-CM

## 2013-12-05 DIAGNOSIS — F988 Other specified behavioral and emotional disorders with onset usually occurring in childhood and adolescence: Secondary | ICD-10-CM

## 2013-12-05 MED ORDER — AMPHETAMINE-DEXTROAMPHET ER 30 MG PO CP24: 30.0000 mg | ORAL_CAPSULE | ORAL | Status: DC

## 2013-12-05 NOTE — Progress Notes (Signed)
Subjective:    Patient ID: Kurt Bishop, male    DOB: 1985/09/05, 28 y.o.   MRN: [id number]  HPI Patient presents today for routine follow up for his ADD. He is currently on Adderall XR 30 mg by mouth every morning. This is working adequately. It allows him to maintain focus at work. He is recently changed jobs. He is now working Holiday representative. He is doing much better with his work. It is causing less stress in his life. His relationship with his wife has improved. He denies any anxiety on medication. He denies any palpitations. He denies any chest pain. His weight is stable. There is no evidence for abuse or diversion. 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 This caused trouble in the past.  Like to continue the medication. Of note on examination today he has a regularly irregular heart beat. It sounds like pronounced sinus arrhythmia. I obtained an EKG today and also shows normal sinus rhythm at 94 beats per minute with normal axis and normal intervals. There is no evidence of ischemia or infarction. I then performed rhythm strip which did show sinus arrhythmia when the patient performed Valsalva maneuver. Past Medical History  Diagnosis Date  . ADD (attention deficit disorder)    Current outpatient prescriptions:amphetamine-dextroamphetamine (ADDERALL XR) 30 MG 24 hr capsule, Take 1 capsule (30 mg total) by mouth every morning., Disp: 30 capsule, Rfl: 0 Allergies  Allergen Reactions  . Amoxicillin Other (See Comments)    When its hot it causes rash    History   Social History  . Marital Status: Married    Spouse Name: N/A    Number of Children: N/A  . Years of Education: N/A   Occupational History  . Not on file.   Social History Main Topics  . Smoking status: Former Smoker    Types: Cigarettes  . Smokeless tobacco: Former Neurosurgeon    Quit date: 06/18/2012  . Alcohol Use: No  . Drug Use: No  . Sexual Activity: Not on  file   Other Topics Concern  . Not on file   Social History Narrative  . No narrative on file      Review of Systems  All other systems reviewed and are negative.      Objective:   Physical Exam  Vitals reviewed. Constitutional: He is oriented to person, place, and time.  Cardiovascular: Normal rate, regular rhythm and normal heart sounds.  Exam reveals no gallop and no friction rub.   No murmur heard. Pulmonary/Chest: Effort normal and breath sounds normal. No respiratory distress. He has no wheezes. He has no rales.  Abdominal: Soft. Bowel sounds are normal. He exhibits no distension. There is no tenderness. There is no rebound and no guarding.  Neurological: He is alert and oriented to person, place, and time. He has normal reflexes. He displays normal reflexes. No cranial nerve deficit. He exhibits normal muscle tone. Coordination normal.  Skin: Skin is warm. No rash noted. No erythema.  Psychiatric: He has a normal mood and affect. His behavior is normal. Judgment and thought content normal.          Assessment & Plan:  1. ADD (attention deficit disorder) Continue Adderall XR 30 mg by mouth daily, August, and September. Recheck in 6 months or as needed. - amphetamine-dextroamphetamine (ADDERALL XR) 30 MG 24 hr capsule; Take 1 capsule (30 mg total) by mouth every morning.  Dispense: 30 capsule; Refill: 0  2.  Tachycardia EKG today in the office is normal. Rhythm strip demonstrates sinus arrhythmia. No further followup is necessary for this. - EKG 12-Lead

## 2014-02-06 ENCOUNTER — Encounter: Payer: Self-pay | Admitting: Family Medicine

## 2014-02-06 ENCOUNTER — Ambulatory Visit (INDEPENDENT_AMBULATORY_CARE_PROVIDER_SITE_OTHER): Payer: BC Managed Care – PPO | Admitting: Family Medicine

## 2014-02-06 VITALS — BP 128/76 | HR 88 | Temp 97.5°F | Resp 18 | Wt 133.0 lb

## 2014-02-06 DIAGNOSIS — J019 Acute sinusitis, unspecified: Secondary | ICD-10-CM

## 2014-02-06 MED ORDER — FLUTICASONE PROPIONATE 50 MCG/ACT NA SUSP: 2.0000 | Freq: Every day | NASAL | Status: DC

## 2014-02-06 MED ORDER — AZITHROMYCIN 250 MG PO TABS: ORAL_TABLET | ORAL | Status: DC

## 2014-02-06 NOTE — Progress Notes (Signed)
   Subjective:    Patient ID: Kurt Bishop, male    DOB: 30-Dec-1985, 28 y.o.   MRN: [id number]  HPI Patient presents with one week of sinus pressure and his frontal and maxillary sinuses, postnasal drip, rhinorrhea, head congestion and headaches. He denies any fever. He denies any pain in his sinuses. He states that this happens every year at the exact same time. Past Medical History  Diagnosis Date  . ADD (attention deficit disorder)    Past Surgical History  Procedure Laterality Date  . Inguinal hernia repair  03/24/2012    Procedure: HERNIA REPAIR INGUINAL ADULT;  Surgeon: Fabio Bering, MD;  Location: [facility];  Service: General;  Laterality: Right;   Current Outpatient Prescriptions on File Prior to Visit  Medication Sig Dispense Refill  . amphetamine-dextroamphetamine (ADDERALL XR) 30 MG 24 hr capsule Take 1 capsule (30 mg total) by mouth every morning.  30 capsule  0   No current facility-administered medications on file prior to visit.   Allergies  Allergen Reactions  . Amoxicillin Other (See Comments)    When its hot it causes rash    History   Social History  . Marital Status: Married    Spouse Name: N/A    Number of Children: N/A  . Years of Education: N/A   Occupational History  . Not on file.   Social History Main Topics  . Smoking status: Former Smoker    Types: Cigarettes  . Smokeless tobacco: Former Neurosurgeon    Quit date: 06/18/2012  . Alcohol Use: No  . Drug Use: No  . Sexual Activity: Not on file   Other Topics Concern  . Not on file   Social History Narrative  . No narrative on file      Review of Systems  All other systems reviewed and are negative.      Objective:   Physical Exam  Vitals reviewed. Constitutional: He appears well-developed and well-nourished.  HENT:  Right Ear: Tympanic membrane, external ear and ear canal normal.  Left Ear: Tympanic membrane, external ear and ear canal normal.  Nose: Mucosal edema and  rhinorrhea present. Right sinus exhibits no maxillary sinus tenderness and no frontal sinus tenderness. Left sinus exhibits no maxillary sinus tenderness and no frontal sinus tenderness.  Mouth/Throat: Oropharynx is clear and moist. No oropharyngeal exudate.  Eyes: Conjunctivae are normal. No scleral icterus.  Neck: Neck supple.  Cardiovascular: Normal rate, regular rhythm and normal heart sounds.   Pulmonary/Chest: Effort normal and breath sounds normal. No respiratory distress. He has no wheezes. He has no rales.  Lymphadenopathy:    He has no cervical adenopathy.          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 Therefore I recommended Flonase 2 sprays each nostril daily. I recommend he do this every year around the same time help prevent this in the future. If the patient develops fever or sinus pain, and begin a Z-Pak for secondary sinusitis.

## 2014-03-24 ENCOUNTER — Encounter: Payer: Self-pay | Admitting: Family Medicine

## 2014-03-24 ENCOUNTER — Ambulatory Visit (INDEPENDENT_AMBULATORY_CARE_PROVIDER_SITE_OTHER): Payer: BC Managed Care – PPO | Admitting: Family Medicine

## 2014-03-24 VITALS — BP 122/76 | HR 78 | Temp 98.1°F | Resp 14 | Ht 70.5 in | Wt 138.0 lb

## 2014-03-24 DIAGNOSIS — F909 Attention-deficit hyperactivity disorder, unspecified type: Secondary | ICD-10-CM

## 2014-03-24 DIAGNOSIS — F988 Other specified behavioral and emotional disorders with onset usually occurring in childhood and adolescence: Secondary | ICD-10-CM

## 2014-03-24 MED ORDER — AMPHETAMINE-DEXTROAMPHET ER 30 MG PO CP24: 30.0000 mg | ORAL_CAPSULE | ORAL | Status: DC

## 2014-03-24 NOTE — Progress Notes (Signed)
   Subjective:    Patient ID: Kurt Bishop, male    DOB: Jun 23, 1985, 28 y.o.   MRN: [id number]  HPI  Patient presents today to get a refill on his Adderall XR 30 mg by mouth every morning. Patient has a history of ADHD. Without the medication he has a difficult time focusing at work. He uses the medication to maintain his focus and that he completes his require responsibilities. Without the medication he frequently gets in trouble at work which could cause him to be fired. However on the medication he is able to concentrate, complete his daily tasks, focus at home. He denies any palpitations on the medicine. He denies any chest pain. He denies any anorexia or anxiety. Past Medical History  Diagnosis Date  . ADD (attention deficit disorder)    Current Outpatient Prescriptions on File Prior to Visit  Medication Sig Dispense Refill  . fluticasone (FLONASE) 50 MCG/ACT nasal spray Place 2 sprays into both nostrils daily. 16 g 6   No current facility-administered medications on file prior to visit.   Allergies  Allergen Reactions  . Amoxicillin Other (See Comments)    When its hot it causes rash    History   Social History  . Marital Status: Married    Spouse Name: N/A    Number of Children: N/A  . Years of Education: N/A   Occupational History  . Not on file.   Social History Main Topics  . Smoking status: Former Smoker    Types: Cigarettes  . Smokeless tobacco: Former NeurosurgeonUser    Quit date: 06/18/2012  . Alcohol Use: No  . Drug Use: No  . Sexual Activity: Not on file   Other Topics Concern  . Not on file   Social History Narrative     Review of Systems  All other systems reviewed and are negative.      Objective:   Physical Exam  Constitutional: He appears well-developed and well-nourished.  Neck: Neck supple. No JVD present.  Cardiovascular: Normal rate, regular rhythm and normal heart sounds.   No murmur heard. Pulmonary/Chest: Effort normal and breath  sounds normal. No respiratory distress. He has no wheezes. He has no rales.  Abdominal: Soft. Bowel sounds are normal. He exhibits no distension. There is no tenderness. There is no rebound and no guarding.  Musculoskeletal: He exhibits no edema.  Lymphadenopathy:    He has no cervical adenopathy.  Vitals reviewed.         Assessment & Plan:  ADD (attention deficit disorder) - Plan: amphetamine-dextroamphetamine (ADDERALL XR) 30 MG 24 hr capsule, DISCONTINUED: amphetamine-dextroamphetamine (ADDERALL XR) 30 MG 24 hr capsule, DISCONTINUED: amphetamine-dextroamphetamine (ADDERALL XR) 30 MG 24 hr capsule  Patient overall is doing very well. I refill his Adderall XR 30 mg by mouth every morning. I gave him 30 tablets and 2 refills. Recheck in 6 months or as needed. The patient also received his flu shot today in clinic.

## 2014-05-29 ENCOUNTER — Telehealth: Payer: Self-pay | Admitting: *Deleted

## 2014-05-29 NOTE — Telephone Encounter (Signed)
PA submitted through [email or web address]  - pt has been on Adderall and vyvanse in the past.

## 2014-05-29 NOTE — Telephone Encounter (Signed)
Pt wife called stating that she went to refill his adddrall at their pharmacy and they had told her that it needed to have an PA done before can refill, pharmacy states they had sent over the request for PA electronically. Please advise!  [facility]

## 2014-05-29 NOTE — Telephone Encounter (Signed)
Insurance sent back form for additional information - this was filled out and faxed to [employer]

## 2014-05-31 NOTE — Telephone Encounter (Signed)
Received PA determination.   PA denied.   Reports that patient must try and fail the branded Adderall XR.   Contacted pharmacy. Was advised that name brand Adderall XR is on formulary with $40 co-pay.

## 2014-07-25 ENCOUNTER — Encounter: Payer: Self-pay | Admitting: Family Medicine

## 2014-07-25 ENCOUNTER — Ambulatory Visit (INDEPENDENT_AMBULATORY_CARE_PROVIDER_SITE_OTHER): Payer: 59 | Admitting: Family Medicine

## 2014-07-25 VITALS — BP 130/90 | HR 74 | Temp 98.1°F | Resp 16 | Ht 70.5 in | Wt 132.0 lb

## 2014-07-25 DIAGNOSIS — F909 Attention-deficit hyperactivity disorder, unspecified type: Secondary | ICD-10-CM | POA: Diagnosis not present

## 2014-07-25 DIAGNOSIS — F988 Other specified behavioral and emotional disorders with onset usually occurring in childhood and adolescence: Secondary | ICD-10-CM

## 2014-07-25 DIAGNOSIS — G4700 Insomnia, unspecified: Secondary | ICD-10-CM

## 2014-07-25 MED ORDER — AMPHETAMINE-DEXTROAMPHET ER 30 MG PO CP24: 30.0000 mg | ORAL_CAPSULE | ORAL | Status: DC

## 2014-07-25 MED ORDER — HYDROXYZINE HCL 25 MG PO TABS: 25.0000 mg | ORAL_TABLET | Freq: Every evening | ORAL | Status: DC | PRN

## 2014-07-25 NOTE — Progress Notes (Signed)
Subjective:    Patient ID: Kurt Bishop, male    DOB: 06-Oct-1985, 29 y.o.   MRN: [id number]  HPI Patient is a very pleasant 29 year old white male who has attention deficit disorder. He is currently on Adderall XR 30 mg by mouth every morning. He is doing well on this medication. With the medication he is able to focus at work and complete his assignments. Without the medication he frequently loses focus, fails to complete his job duties, gets in trouble at work, and forgets to perform certain responsibilities related to his job. He denies any chest pain or shortness of breath. His blood pressure today is slightly elevated. It has never been elevated in the past. This is something that we will continue to monitor. He is complaining of insomnia. This seems to be unrelated to the Adderall. He has insomnia even on days when he does not take Adderall such as the weekend. Patient states that he lies in bed unable to sleep. He is unable to turn his thoughts off. He denies any depression stress or anxiety. His wife is pregnant with her fourth child. Past Medical History  Diagnosis Date  . ADD (attention deficit disorder)    Past Surgical History  Procedure Laterality Date  . Inguinal hernia repair  03/24/2012    Procedure: HERNIA REPAIR INGUINAL ADULT;  Surgeon: Fabio Bering, MD;  Location: [facility];  Service: General;  Laterality: Right;   Current Outpatient Prescriptions on File Prior to Visit  Medication Sig Dispense Refill  . fluticasone (FLONASE) 50 MCG/ACT nasal spray Place 2 sprays into both nostrils daily. 16 g 6   No current facility-administered medications on file prior to visit.   Allergies  Allergen Reactions  . Amoxicillin Other (See Comments)    When its hot it causes rash    History   Social History  . Marital Status: Married    Spouse Name: N/A  . Number of Children: N/A  . Years of Education: N/A   Occupational History  . Not on file.   Social History Main  Topics  . Smoking status: Former Smoker    Types: Cigarettes  . Smokeless tobacco: Former Neurosurgeon    Quit date: 06/18/2012  . Alcohol Use: No  . Drug Use: No  . Sexual Activity: Not on file   Other Topics Concern  . Not on file   Social History Narrative      Review of Systems  All other systems reviewed and are negative.      Objective:   Physical Exam  Constitutional: He appears well-developed and well-nourished.  Neck: Neck supple.  Cardiovascular: Normal rate, regular rhythm and normal heart sounds.   Pulmonary/Chest: Effort normal and breath sounds normal. No respiratory distress. He has no wheezes. He has no rales. He exhibits no tenderness.  Lymphadenopathy:    He has no cervical adenopathy.  Psychiatric: He has a normal mood and affect. His behavior is normal. Judgment and thought content normal.  Vitals reviewed.         Assessment & Plan:  Insomnia - Plan: hydrOXYzine (ATARAX/VISTARIL) 25 MG tablet  ADD (attention deficit disorder) - Plan: amphetamine-dextroamphetamine (ADDERALL XR) 30 MG 24 hr capsule, DISCONTINUED: amphetamine-dextroamphetamine (ADDERALL XR) 30 MG 24 hr capsule, DISCONTINUED: amphetamine-dextroamphetamine (ADDERALL XR) 30 MG 24 hr capsule  I will try starting the patient on hydroxyzine 25 mg by mouth daily at bedtime to help with insomnia. Patient would like to avoid addictive medication and we also discussed optimal  sleep hygiene. I will continue to Adderall XR 30 mg by mouth every morning. I gave him 30 tablets and refills for the month of April as well as May. Recheck in 6 months or as needed. Monitor his blood pressure closely I recommended a low-salt diet and stress reduction.

## 2014-07-31 ENCOUNTER — Telehealth: Payer: Self-pay | Admitting: Family Medicine

## 2014-07-31 MED ORDER — TRAZODONE HCL 50 MG PO TABS: ORAL_TABLET | ORAL | Status: DC

## 2014-07-31 NOTE — Telephone Encounter (Signed)
Dc hydroxyzine and try trazodone 50-100 poqhs prn insomnia.

## 2014-07-31 NOTE — Telephone Encounter (Signed)
Med sent to pharm and pt's wife aware 

## 2014-07-31 NOTE — Telephone Encounter (Signed)
[phone]  PT was prescribed something to help him sleep and it is not working would like something stronger ( he was prescribed this last week)  [facility]

## 2014-08-16 ENCOUNTER — Telehealth: Payer: Self-pay | Admitting: Family Medicine

## 2014-08-16 NOTE — Telephone Encounter (Signed)
pts wife called and stated that the pt has been having some issues with his teeth and she believes he has an infection in one that it is causing severe pain in his jaw, ear and down his neck and wanted to know what she should do? She has called several dental places and they are booked out or they can not afford as they have no dental insurance. I recommended that she take him to urgent care to have checked or if he can make it until tomorrow will work him in with Dr. Tanya NonesPickard. She stated that she will take him to UC.

## 2014-09-17 DIAGNOSIS — R55 Syncope and collapse: Secondary | ICD-10-CM

## 2014-09-17 HISTORY — DX: Syncope and collapse: R55

## 2014-09-26 ENCOUNTER — Telehealth: Payer: Self-pay | Admitting: Family Medicine

## 2014-09-26 NOTE — Telephone Encounter (Signed)
Pt's insurance covers Adderall name brand however the co-pay is too expensive for him. PA submitted through [email or web address] for him to have generic.

## 2014-09-28 NOTE — Telephone Encounter (Signed)
Received PA determination.   PA denied.   Pharmacy made aware.

## 2014-10-05 ENCOUNTER — Encounter (HOSPITAL_COMMUNITY): Payer: Self-pay | Admitting: Cardiology

## 2014-10-05 ENCOUNTER — Emergency Department (HOSPITAL_COMMUNITY)
Admission: EM | Admit: 2014-10-05 | Discharge: 2014-10-05 | Disposition: A | Discharge status: Home / Self Care | Payer: 59 | Attending: Emergency Medicine | Admitting: Emergency Medicine

## 2014-10-05 ENCOUNTER — Emergency Department (HOSPITAL_COMMUNITY): Payer: 59

## 2014-10-05 ENCOUNTER — Telehealth: Payer: Self-pay | Admitting: Family Medicine

## 2014-10-05 ENCOUNTER — Ambulatory Visit: Payer: 59 | Admitting: Family Medicine

## 2014-10-05 DIAGNOSIS — R531 Weakness: Secondary | ICD-10-CM | POA: Diagnosis not present

## 2014-10-05 DIAGNOSIS — Z87891 Personal history of nicotine dependence: Secondary | ICD-10-CM | POA: Insufficient documentation

## 2014-10-05 DIAGNOSIS — R55 Syncope and collapse: Secondary | ICD-10-CM | POA: Diagnosis not present

## 2014-10-05 DIAGNOSIS — Z880 Allergy status to penicillin: Secondary | ICD-10-CM | POA: Insufficient documentation

## 2014-10-05 DIAGNOSIS — F988 Other specified behavioral and emotional disorders with onset usually occurring in childhood and adolescence: Secondary | ICD-10-CM

## 2014-10-05 DIAGNOSIS — Z7952 Long term (current) use of systemic steroids: Secondary | ICD-10-CM | POA: Diagnosis not present

## 2014-10-05 DIAGNOSIS — Z8659 Personal history of other mental and behavioral disorders: Secondary | ICD-10-CM | POA: Diagnosis not present

## 2014-10-05 DIAGNOSIS — R630 Anorexia: Secondary | ICD-10-CM | POA: Insufficient documentation

## 2014-10-05 LAB — BASIC METABOLIC PANEL
Anion gap: 6 (ref 5–15)
BUN: 13 mg/dL (ref 6–20)
CHLORIDE: 103 mmol/L (ref 101–111)
CO2: 28 mmol/L (ref 22–32)
Calcium: 9.1 mg/dL (ref 8.9–10.3)
Creatinine, Ser: 0.96 mg/dL (ref 0.61–1.24)
GFR calc Af Amer: >60 mL/min (ref >60)
GFR calc non Af Amer: >60 mL/min (ref >60)
GLUCOSE: 88 mg/dL (ref 65–99)
Potassium: 4.3 mmol/L (ref 3.5–5.1)
Sodium: 137 mmol/L (ref 135–145)

## 2014-10-05 LAB — CBC WITH DIFFERENTIAL/PLATELET
Basophils Absolute: 0 10*3/uL (ref 0.0–0.1)
Basophils Relative: 0 % (ref 0–1)
Eosinophils Absolute: 0.1 10*3/uL (ref 0.0–0.7)
Eosinophils Relative: 1 % (ref 0–5)
HCT: 39.4 % (ref 39.0–52.0)
Hemoglobin: 13.7 g/dL (ref 13.0–17.0)
Lymphocytes Relative: 14 % (ref 12–46)
Lymphs Abs: 1.2 10*3/uL (ref 0.7–4.0)
MCH: 28.7 pg (ref 26.0–34.0)
MCHC: 34.8 g/dL (ref 30.0–36.0)
MCV: 82.4 fL (ref 78.0–100.0)
MONOS PCT: 6 % (ref 3–12)
Monocytes Absolute: 0.5 10*3/uL (ref 0.1–1.0)
NEUTROS ABS: 6.8 10*3/uL (ref 1.7–7.7)
NEUTROS PCT: 79 % — AB (ref 43–77)
Platelets: 188 10*3/uL (ref 150–400)
RBC: 4.78 MIL/uL (ref 4.22–5.81)
RDW: 12.4 % (ref 11.5–15.5)
WBC: 8.5 10*3/uL (ref 4.0–10.5)

## 2014-10-05 MED ORDER — AMPHETAMINE-DEXTROAMPHET ER 30 MG PO CP24: 30.0000 mg | ORAL_CAPSULE | ORAL | Status: DC

## 2014-10-05 MED ORDER — SODIUM CHLORIDE 0.9 % IV BOLUS (SEPSIS)
1000.0000 mL | Freq: Once | INTRAVENOUS | Status: AC
  Administered 2014-10-05: 1000 mL via INTRAVENOUS

## 2014-10-05 NOTE — ED Notes (Signed)
Pt ambulated well with no difficulties. MD made aware.

## 2014-10-05 NOTE — ED Provider Notes (Signed)
CSN: [id number]     Arrival date & time 10/05/14  1258 History   First MD Initiated Contact with Patient 10/05/14 1450     Chief Complaint  Patient presents with  . Loss of Consciousness     (Consider location/radiation/quality/duration/timing/severity/associated sxs/prior Treatment) HPI Comments: Patient presents via EMS after syncopal episode. 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 He went up to try to walk and became lightheaded and dizzy. His coworkers came to his assistance and he passed out while trying to walk to a place to sit down. Unsure if he hit his head. Found to be hypotensive on arrival by EMS. Feels back to baseline now except for headache. Denies chest pain or shortness of breath. Reports syncopal episode 3 years ago in setting of blood loss. Admits to not eating lunch today but ate breakfast normally. Denies any chronic medical problems. Denies any heart or lung issues. Denies any seizure activity. No bowel or bladder incontinence. No tongue biting. No chest pain or shortness of breath.  Patient is a 29 y.o. male presenting with syncope. The history is provided by the patient and the EMS personnel. The history is limited by the condition of the patient.  Loss of Consciousness Associated symptoms: dizziness and weakness   Associated symptoms: no chest pain, no fever, no nausea, no shortness of breath and no vomiting     Past Medical History  Diagnosis Date  . ADD (attention deficit disorder)    Past Surgical History  Procedure Laterality Date  . Inguinal hernia repair  03/24/2012    Procedure: HERNIA REPAIR INGUINAL ADULT;  Surgeon: Fabio BeringBrent C Ziegler, MD;  Location: [facility];  Service: General;  Laterality: Right;   Family History  Problem Relation Age of Onset  . Diabetes Paternal Grandmother   . Hypertension Paternal Grandmother    History  Substance Use Topics  . Smoking status: Former Smoker    Types: Cigarettes  .  Smokeless tobacco: Former NeurosurgeonUser    Quit date: 06/18/2012  . Alcohol Use: No    Review of Systems  Constitutional: Positive for activity change and appetite change. Negative for fever.  HENT: Negative for congestion and rhinorrhea.   Eyes: Negative for visual disturbance.  Respiratory: Negative for cough, chest tightness and shortness of breath.   Cardiovascular: Positive for syncope. Negative for chest pain and leg swelling.  Gastrointestinal: Negative for nausea, vomiting and abdominal pain.  Genitourinary: Negative for dysuria, hematuria and testicular pain.  Musculoskeletal: Negative for myalgias and arthralgias.  Skin: Negative for rash.  Neurological: Positive for dizziness, weakness and light-headedness.  A complete 10 system review of systems was obtained and all systems are negative except as noted in the HPI and PMH.      Allergies  Amoxicillin  Home Medications   Prior to Admission medications   Medication Sig Start Date End Date Taking? Authorizing Provider  acetaminophen (TYLENOL) 500 MG tablet Take 1,000 mg by mouth every 6 (six) hours as needed for headache.   Yes Historical Provider, MD  amphetamine-dextroamphetamine (ADDERALL XR) 30 MG 24 hr capsule Take 1 capsule (30 mg total) by mouth every morning. 10/05/14  Yes Donita BrooksWarren T Pickard, MD  fluticasone (FLONASE) 50 MCG/ACT nasal spray Place 2 sprays into both nostrils daily. Patient taking differently: Place 2 sprays into both nostrils daily as needed for allergies.  02/06/14  Yes Donita BrooksWarren T Pickard, MD  traZODone (DESYREL) 50 MG tablet 1-2 tab po qhs Patient  not taking: Reported on 10/05/2014 07/31/14   Donita BrooksWarren T Pickard, MD   BP 120/74 mmHg  Pulse 65  Temp(Src) 99.1 F (37.3 C) (Oral)  Resp 16  Ht 5\' 10"  (1.778 m)  Wt 135 lb (61.236 kg)  BMI 19.37 kg/m2  SpO2 100% Physical Exam  Constitutional: He is oriented to person, place, and time. He appears well-developed and well-nourished. No distress.  HENT:  Head:  Normocephalic and atraumatic.  Mouth/Throat: Oropharynx is clear and moist. No oropharyngeal exudate.  Eyes: Conjunctivae and EOM are normal. Pupils are equal, round, and reactive to light.  Neck: Normal range of motion. Neck supple.  No C spine tenderness  Cardiovascular: Normal rate, regular rhythm, normal heart sounds and intact distal pulses.   No murmur heard. Pulmonary/Chest: Effort normal and breath sounds normal. No respiratory distress.  Abdominal: Soft. There is no tenderness. There is no rebound and no guarding.  Musculoskeletal: Normal range of motion. He exhibits no edema or tenderness.  Neurological: He is alert and oriented to person, place, and time. No cranial nerve deficit. He exhibits normal muscle tone. Coordination normal.  No ataxia on finger to nose bilaterally. No pronator drift. 5/5 strength throughout. CN 2-12 intact. Negative Romberg. Equal grip strength. Sensation intact. Gait is normal.   Skin: Skin is warm.  Psychiatric: He has a normal mood and affect. His behavior is normal.  Nursing note and vitals reviewed.   ED Course  Procedures (including critical care time) Labs Review Labs Reviewed  CBC WITH DIFFERENTIAL/PLATELET - Abnormal; Notable for the following:    Neutrophils Relative % 79 (*)    All other components within normal limits  BASIC METABOLIC PANEL  URINE RAPID DRUG SCREEN (HOSP PERFORMED)    Imaging Review Ct Head Wo Contrast  10/05/2014   CLINICAL DATA:  Witnessed syncopal episode at work, headache, episodes and weakness and dizziness for a while, former smoker  EXAM: CT HEAD WITHOUT CONTRAST  TECHNIQUE: Contiguous axial images were obtained from the base of the skull through the vertex without intravenous contrast.  COMPARISON:  None  FINDINGS: Normal ventricular morphology.  No midline shift or mass effect.  Normal appearance of brain parenchyma.  No intracranial hemorrhage, mass lesion, or acute infarction.  Visualized paranasal sinuses and  mastoid air cells clear.  Bones unremarkable.  IMPRESSION: Normal exam.   Electronically Signed   By: Ulyses SouthwardMark  Boles M.D.   On: 10/05/2014 16:14   Dg Finger Thumb Left  10/05/2014   CLINICAL DATA:  Loss of consciousness. Trauma to the thumb today. Thumb struck with a hammer today.  EXAM: LEFT THUMB 2+V  COMPARISON:  None.  FINDINGS: There is no evidence of fracture or dislocation. There is no evidence of arthropathy or other focal bone abnormality. Soft tissues are unremarkable  IMPRESSION: Negative.   Electronically Signed   By: Andreas NewportGeoffrey  Lamke M.D.   On: 10/05/2014 16:01     EKG Interpretation   Date/Time:  Thursday Oct 05 2014 15:57:54 EDT Ventricular Rate:  54 PR Interval:  141 QRS Duration: 93 QT Interval:  416 QTC Calculation: 394 R Axis:   95 Text Interpretation:  Sinus rhythm Borderline right axis deviation No  significant change was found Confirmed by Manus GunningANCOUR  MD, [place] Youngblood [phone]([id number]) on  10/05/2014 4:29:29 PM      MDM   Final diagnoses:  Syncope, unspecified syncope type   Syncopal episode preceded by dizziness, lightheadedness, nausea and diaphoresis. No chest pain or shortness of breath.. EKG shows early repolarization.  Workup is negative. No evidence of Brugada or prolonged QT. Patient tolerating by mouth and ambulatory. X-ray of thumb is negative.  Suspect vasovagal cause of syncope. Encourage oral hydration at home. Follow with PCP. Return precautions discussed.  Glynn Octave, MD 10/05/14 [phone]

## 2014-10-05 NOTE — Telephone Encounter (Signed)
Rx's printed and pt aware ready for pick up after 2PM today

## 2014-10-05 NOTE — Discharge Instructions (Signed)
Syncope °Syncope is a medical term for fainting or passing out. This means you lose consciousness and drop to the ground. People are generally unconscious for less than 5 minutes. You may have some muscle twitches for up to 15 seconds before waking up and returning to normal. Syncope occurs more often in older adults, but it can happen to anyone. While most causes of syncope are not dangerous, syncope can be a sign of a serious medical problem. It is important to seek medical care.  °CAUSES  °Syncope is caused by a sudden drop in blood flow to the brain. The specific cause is often not determined. Factors that can bring on syncope include: °· Taking medicines that lower blood pressure. °· Sudden changes in posture, such as standing up quickly. °· Taking more medicine than prescribed. °· Standing in one place for too long. °· Seizure disorders. °· Dehydration and excessive exposure to heat. °· Low blood sugar (hypoglycemia). °· Straining to have a bowel movement. °· Heart disease, irregular heartbeat, or other circulatory problems. °· Fear, emotional distress, seeing blood, or severe pain. °SYMPTOMS  °Right before fainting, you may: °· Feel dizzy or light-headed. °· Feel nauseous. °· See all white or all black in your field of vision. °· Have cold, clammy skin. °DIAGNOSIS  °Your health care provider will ask about your symptoms, perform a physical exam, and perform an electrocardiogram (ECG) to record the electrical activity of your heart. Your health care provider may also perform other heart or blood tests to determine the cause of your syncope which may include: °· Transthoracic echocardiogram (TTE). During echocardiography, sound waves are used to evaluate how blood flows through your heart. °· Transesophageal echocardiogram (TEE). °· Cardiac monitoring. This allows your health care provider to monitor your heart rate and rhythm in real time. °· Holter monitor. This is a portable device that records your  heartbeat and can help diagnose heart arrhythmias. It allows your health care provider to track your heart activity for several days, if needed. °· Stress tests by exercise or by giving medicine that makes the heart beat faster. °TREATMENT  °In most cases, no treatment is needed. Depending on the cause of your syncope, your health care provider may recommend changing or stopping some of your medicines. °HOME CARE INSTRUCTIONS °· Have someone stay with you until you feel stable. °· Do not drive, use machinery, or play sports until your health care provider says it is okay. °· Keep all follow-up appointments as directed by your health care provider. °· Lie down right away if you start feeling like you might faint. Breathe deeply and steadily. Wait until all the symptoms have passed. °· Drink enough fluids to keep your urine clear or pale yellow. °· If you are taking blood pressure or heart medicine, get up slowly and take several minutes to sit and then stand. This can reduce dizziness. °SEEK IMMEDIATE MEDICAL CARE IF:  °· You have a severe headache. °· You have unusual pain in the chest, abdomen, or back. °· You are bleeding from your mouth or rectum, or you have black or tarry stool. °· You have an irregular or very fast heartbeat. °· You have pain with breathing. °· You have repeated fainting or seizure-like jerking during an episode. °· You faint when sitting or lying down. °· You have confusion. °· You have trouble walking. °· You have severe weakness. °· You have vision problems. °If you fainted, call your local emergency services (911 in [place] Do not drive   yourself to the hospital.  °MAKE SURE YOU: °· Understand these instructions. °· Will watch your condition. °· Will get help right away if you are not doing well or get worse. °Document Released: 05/05/2005 Document Revised: 05/10/2013 Document Reviewed: 07/04/2011 °Exit[employer]® Patient Information ©2015 [employer]. This information is not intended to replace  advice given to you by your health care provider. Make sure you discuss any questions you have with your health care provider. ° °

## 2014-10-05 NOTE — Telephone Encounter (Signed)
Needs refill of Adderall XR 30.  LOV 07/25/14.  Can he have 3 month refill please?

## 2014-10-05 NOTE — Telephone Encounter (Signed)
Ok x 3 months

## 2014-10-05 NOTE — ED Notes (Addendum)
Witnessed syncopal episode at work.   Per patient c/o headache,  States he is unsure if he fell or not?  CBG 99 .  Blood pressure 89/53 prior to arrival.  Receiving IV fluids.  Per wife pt has had episodes of weakness and dizziness for  A while.

## 2014-10-06 ENCOUNTER — Encounter: Payer: Self-pay | Admitting: Family Medicine

## 2014-10-06 ENCOUNTER — Ambulatory Visit (INDEPENDENT_AMBULATORY_CARE_PROVIDER_SITE_OTHER): Payer: 59 | Admitting: Family Medicine

## 2014-10-06 VITALS — BP 122/68 | HR 64 | Temp 98.1°F | Resp 14 | Ht 70.5 in | Wt 133.0 lb

## 2014-10-06 DIAGNOSIS — Z09 Encounter for follow-up examination after completed treatment for conditions other than malignant neoplasm: Secondary | ICD-10-CM | POA: Diagnosis not present

## 2014-10-06 DIAGNOSIS — R55 Syncope and collapse: Secondary | ICD-10-CM | POA: Diagnosis not present

## 2014-10-06 NOTE — Progress Notes (Signed)
Subjective:    Patient ID: Kurt Bishop, male    DOB: [birth year], 29 y.o.   MRN: [id number]  HPI Yesterday, the patient hit his hand with a hammer while working. He was also in a kneeling position squatting for approximately 30 minutes. The patient went to stand up and became extremely lightheaded. He felt like he was going to pass out. He sat down but still lost consciousness for a few seconds. When his coworkers tried to stand him up to walk him into their business, the patient lost consciousness again. He denied any chest pain palpitations or seizure-like activity. He denies any bowel or bladder incontinence. He did not bite his tongue. He denies any muscle soreness. He denies any racing a rapid or irregular heartbeats. CT scan of the head was negative in the emergency room. I reviewed the EKG in the emergency room which shows normal QTC intervals and no evidence of Brugada syndrome or Wolff-Parkinson-White. Lab work in the hospital was completely normal including a CBC, CMP. Blood sugar in and the last was over 90. Past Medical History  Diagnosis Date  . ADD (attention deficit disorder)    Current Outpatient Prescriptions on File Prior to Visit  Medication Sig Dispense Refill  . acetaminophen (TYLENOL) 500 MG tablet Take 1,000 mg by mouth every 6 (six) hours as needed for headache.    . amphetamine-dextroamphetamine (ADDERALL XR) 30 MG 24 hr capsule Take 1 capsule (30 mg total) by mouth every morning. 30 capsule 0  . fluticasone (FLONASE) 50 MCG/ACT nasal spray Place 2 sprays into both nostrils daily. (Patient taking differently: Place 2 sprays into both nostrils daily as needed for allergies. ) 16 g 6   No current facility-administered medications on file prior to visit.   Allergies  Allergen Reactions  . Amoxicillin Other (See Comments)    When its hot it causes rash    History   Social History  . Marital Status: Married    Spouse Name: N/A  . Number of Children: N/A  .  Years of Education: N/A   Occupational History  . Not on file.   Social History Main Topics  . Smoking status: Former Smoker    Types: Cigarettes  . Smokeless tobacco: Former NeurosurgeonUser    Quit date: 06/18/2012  . Alcohol Use: No  . Drug Use: No  . Sexual Activity: Not on file   Other Topics Concern  . Not on file   Social History Narrative      Review of Systems  All other systems reviewed and are negative.      Objective:   Physical Exam  Constitutional: He is oriented to person, place, and time. He appears well-developed and well-nourished. No distress.  HENT:  Head: Normocephalic and atraumatic.  Right Ear: External ear normal.  Left Ear: External ear normal.  Nose: Nose normal.  Mouth/Throat: Oropharynx is clear and moist. No oropharyngeal exudate.  Eyes: Conjunctivae and EOM are normal. Pupils are equal, round, and reactive to light. Right eye exhibits no discharge. Left eye exhibits no discharge. No scleral icterus.  Neck: Normal range of motion. Neck supple. No JVD present. No thyromegaly present.  Cardiovascular: Normal rate, regular rhythm and normal heart sounds.  Exam reveals no gallop and no friction rub.   No murmur heard. Pulmonary/Chest: Effort normal and breath sounds normal. No respiratory distress. He has no wheezes. He has no rales.  Lymphadenopathy:    He has no cervical adenopathy.  Neurological: He is alert  and oriented to person, place, and time. He has normal reflexes. He displays normal reflexes. No cranial nerve deficit. He exhibits normal muscle tone. Coordination normal.  Skin: He is not diaphoretic.  Vitals reviewed.         Assessment & Plan:  Vasovagal syncope  Hospital discharge follow-up  Had a similar incident 4 years ago when he cut his thumb and was bleeding. There was definitely vasovagal syncope. I believe this was likely vasovagal syncope as well. We had a discussion about possibly referring the patient for an echocardiogram  to evaluate for any valve abnormalities however his cardiac exam is completely normal I do not hear a murmur and therefore I think it is very unlikely to show any abnormalities such as hypertrophic cardiomyopathy. We also discussed possibly having the patient wear an event monitor/Holter monitor to evaluate for any cardiac arrhythmias. The patient would like to avoid any unnecessary costs because he is already have a large hospital bill. Therefore the present time he agrees to monitor clinically. If he develops any chest pain or palpitations or rapid heartbeat we will immediately proceed with workup at the present time however I reassured the patient that this is likely vasovagal syncope and no further follow-up is required

## 2015-01-12 ENCOUNTER — Encounter: Payer: Self-pay | Admitting: Family Medicine

## 2015-01-12 ENCOUNTER — Ambulatory Visit (INDEPENDENT_AMBULATORY_CARE_PROVIDER_SITE_OTHER): Payer: 59 | Admitting: Family Medicine

## 2015-01-12 VITALS — BP 100/70 | HR 100 | Temp 98.4°F | Resp 16 | Ht 70.5 in | Wt 133.0 lb

## 2015-01-12 DIAGNOSIS — F909 Attention-deficit hyperactivity disorder, unspecified type: Secondary | ICD-10-CM

## 2015-01-12 DIAGNOSIS — F988 Other specified behavioral and emotional disorders with onset usually occurring in childhood and adolescence: Secondary | ICD-10-CM

## 2015-01-12 MED ORDER — LISDEXAMFETAMINE DIMESYLATE 60 MG PO CAPS: 60.0000 mg | ORAL_CAPSULE | ORAL | Status: DC

## 2015-01-12 NOTE — Progress Notes (Signed)
Subjective:    Patient ID: Kurt Bishop, male    DOB: 09/16/1985, 29 y.o.   MRN: [id number]  HPI  11/15 Patient presents today to get a refill on his Adderall XR 30 mg by mouth every morning. Patient has a history of ADHD. Without the medication he has a difficult time focusing at work. He uses the medication to maintain his focus and that he completes his require responsibilities. Without the medication he frequently gets in trouble at work which could cause him to be fired. However on the medication he is able to concentrate, complete his daily tasks, focus at home. He denies any palpitations on the medicine. He denies any chest pain. He denies any anorexia or anxiety.  At that time, my plan was: Patient overall is doing very well. I refill his Adderall XR 30 mg by mouth every morning. I gave him 30 tablets and 2 refills. Recheck in 6 months or as needed. The patient also received his flu shot today in clinic.  01/12/15 Patient is currently taking Adderall XR 30 mg by mouth every morning. He has to take the medication at 5:00 in the morning due to his job. It frequently wears off by 2:00 in the afternoon. He still has 3 or 4 hours of work remaining.  He has met his deductible for the year and he would really like to try to switch back to Vyvanse due to the fact the medication lasted longer and was more consistent throughout the day. He certainly experienced better performance on that medication. He denies any palpitations or chest pain or shortness of breath. He has had no further syncopal episodes similar to the one that he had back in May. Past Medical History  Diagnosis Date  . ADD (attention deficit disorder)    Current Outpatient Prescriptions on File Prior to Visit  Medication Sig Dispense Refill  . acetaminophen (TYLENOL) 500 MG tablet Take 1,000 mg by mouth every 6 (six) hours as needed for headache.    . amphetamine-dextroamphetamine (ADDERALL XR) 30 MG 24 hr capsule Take 1  capsule (30 mg total) by mouth every morning. 30 capsule 0  . fluticasone (FLONASE) 50 MCG/ACT nasal spray Place 2 sprays into both nostrils daily. (Patient taking differently: Place 2 sprays into both nostrils daily as needed for allergies. ) 16 g 6   No current facility-administered medications on file prior to visit.   Allergies  Allergen Reactions  . Amoxicillin Other (See Comments)    When its hot it causes rash    Social History   Social History  . Marital Status: Married    Spouse Name: N/A  . Number of Children: N/A  . Years of Education: N/A   Occupational History  . Not on file.   Social History Main Topics  . Smoking status: Former Smoker    Types: Cigarettes  . Smokeless tobacco: Former Systems developer    Quit date: 06/18/2012  . Alcohol Use: No  . Drug Use: No  . Sexual Activity: Not on file   Other Topics Concern  . Not on file   Social History Narrative     Review of Systems  All other systems reviewed and are negative.      Objective:   Physical Exam  Constitutional: He appears well-developed and well-nourished.  Neck: Neck supple. No JVD present.  Cardiovascular: Normal rate, regular rhythm and normal heart sounds.   No murmur heard. Pulmonary/Chest: Effort normal and breath sounds normal. No respiratory distress.  He has no wheezes. He has no rales.  Abdominal: Soft. Bowel sounds are normal. He exhibits no distension. There is no tenderness. There is no rebound and no guarding.  Musculoskeletal: He exhibits no edema.  Lymphadenopathy:    He has no cervical adenopathy.  Vitals reviewed.         Assessment & Plan:  ADD (attention deficit disorder)  Switch Adderall to Vyvanse 60 mg by mouth every morning. I gave the patient 3 months worth of refills. Recheck in 6 months or sooner if necessary.

## 2015-01-15 ENCOUNTER — Telehealth: Payer: Self-pay | Admitting: Family Medicine

## 2015-01-15 ENCOUNTER — Encounter: Payer: Self-pay | Admitting: Physician Assistant

## 2015-01-15 ENCOUNTER — Ambulatory Visit (INDEPENDENT_AMBULATORY_CARE_PROVIDER_SITE_OTHER): Payer: 59 | Admitting: Physician Assistant

## 2015-01-15 VITALS — BP 100/70 | HR 70 | Temp 97.9°F | Resp 18 | Wt 133.0 lb

## 2015-01-15 DIAGNOSIS — M25571 Pain in right ankle and joints of right foot: Secondary | ICD-10-CM | POA: Diagnosis not present

## 2015-01-15 DIAGNOSIS — M79671 Pain in right foot: Secondary | ICD-10-CM

## 2015-01-15 MED ORDER — AMPHETAMINE-DEXTROAMPHET ER 30 MG PO CP24: 30.0000 mg | ORAL_CAPSULE | ORAL | Status: DC

## 2015-01-15 NOTE — Telephone Encounter (Signed)
ok 

## 2015-01-15 NOTE — Telephone Encounter (Signed)
Patient is here in the office states his insurance won't pay for vyvanse he would like to get a prescription for Adderal while he is here in the office. Please advise.

## 2015-01-15 NOTE — Telephone Encounter (Signed)
RX printed, left up front and patient's wife aware to pick up  

## 2015-01-15 NOTE — Progress Notes (Signed)
Patient ID: Kurt Bishop MRN: [id number], DOB: [birth year], 29 y.o. Date of Encounter: 01/15/2015, 1:58 PM    Chief Complaint:  Chief Complaint  Patient presents with  . OTHER    foot injury slipped on ground from wet playing with water hose, Ribs also working since fall     HPI: 29 y.o. year old white male presents with above.  Says that yesterday afternoon he was spraying water from water hose for his kids to play in.  Says the faucette to turn it off is next to [facility] unit.  When he went to turn off the water, his foot slipped on the wet ground and slipped into a "small ditch" --his ankle twisted, causing him to "fall into" the side of the [facility] unit---his Right flank.  Says that at rest he feels no pain in the flank. With deep breath, feels no pain there. Says when he lifts the right arm, he feels soreness in that area.    Also, twisted his right ankle. Has no pain infoot or ankle when he is at rest. Slept fine last night without pain.Says even when he stands still, really doesn't feel much pain with this weightbearing.  Says he feels pain when he flexes/extends--such as with walking.  He works doing maintenance type work, Administrator, sports, Catering manager. Says it all involves walking and there is no "light duty".  Home Meds:   Outpatient Prescriptions Prior to Visit  Medication Sig Dispense Refill  . acetaminophen (TYLENOL) 500 MG tablet Take 1,000 mg by mouth every 6 (six) hours as needed for headache.    . amphetamine-dextroamphetamine (ADDERALL XR) 30 MG 24 hr capsule Take 1 capsule (30 mg total) by mouth every morning. 30 capsule 0  . fluticasone (FLONASE) 50 MCG/ACT nasal spray Place 2 sprays into both nostrils daily. (Patient taking differently: Place 2 sprays into both nostrils daily as needed for allergies. ) 16 g 6  . lisdexamfetamine (VYVANSE) 60 MG capsule Take 1 capsule (60 mg total) by mouth every morning. (Patient not taking: Reported on 01/15/2015) 30 capsule 0   No  facility-administered medications prior to visit.    Allergies:  Allergies  Allergen Reactions  . Amoxicillin Other (See Comments)    When its hot it causes rash       Review of Systems: See HPI for pertinent ROS. All other ROS negative.    Physical Exam: Blood pressure 100/70, pulse 70, temperature 97.9 F (36.6 C), temperature source Oral, resp. rate 18, weight 133 lb (60.328 kg)., Body mass index is 18.81 kg/(m^2). General:  THin WM. Appears in no acute distress. Neck: Supple. No thyromegaly. No lymphadenopathy. Lungs: Clear bilaterally to auscultation without wheezes, rales, or rhonchi. Breathing is unlabored. Heart: Regular rhythm. No murmurs, rubs, or gallops. Msk:  Strength and tone normal for age. Right Flank: No ecchymosis. No excoriation. Minimal tenderness with palpation.  Right Ankle/Foot:  Inspection: Normal---No ecchymosis. No swelling.  Pain with palpation anterio-lateral aspect of dorsum of foot.  Pain with flexion and extension of ankle and with inversion and eversion.  Extremities/Skin: Warm and dry. Neuro: Alert and oriented X 3. Moves all extremities spontaneously. Gait is normal. CNII-XII grossly in tact. Psych:  Responds to questions appropriately with a normal affect.     ASSESSMENT AND PLAN:  29 y.o. year old male with  1. Right ankle pain - DG Ankle Complete Right; Future  2. Right foot pain - DG Foot Complete Right; Future  Will obtain XRay to R/O Fracture.  If fracture, will treat accordingly.  If no fracture, then sprain. Letter given out of work tody and Advertising account executive. Rest--Stay off the foot-avoid weight bearing. Ice. Elevate.  Gave handout with exercises to do after pain subsides---to strengthen area after sprain.    [address] [place], [place], [facility] 01/15/2015 1:58 PM

## 2015-01-29 ENCOUNTER — Telehealth: Payer: Self-pay | Admitting: Family Medicine

## 2015-01-29 NOTE — Telephone Encounter (Signed)
?   OK to Refill for 3 months 

## 2015-01-29 NOTE — Telephone Encounter (Signed)
[phone] Patient's wife calling to see if the adderall that was prescribed for her husband can be 3 month supply

## 2015-01-30 NOTE — Telephone Encounter (Signed)
ok 

## 2015-01-31 NOTE — Telephone Encounter (Signed)
LMOVM with details about the procedure for 3 months of adderall and instructed to call back

## 2015-02-01 ENCOUNTER — Ambulatory Visit (INDEPENDENT_AMBULATORY_CARE_PROVIDER_SITE_OTHER): Payer: 59 | Admitting: Physician Assistant

## 2015-02-01 ENCOUNTER — Encounter: Payer: Self-pay | Admitting: Physician Assistant

## 2015-02-01 ENCOUNTER — Encounter: Payer: Self-pay | Admitting: Family Medicine

## 2015-02-01 VITALS — BP 112/76 | HR 76 | Temp 97.4°F | Resp 18

## 2015-02-01 DIAGNOSIS — J0191 Acute recurrent sinusitis, unspecified: Secondary | ICD-10-CM

## 2015-02-01 MED ORDER — PREDNISONE 20 MG PO TABS: ORAL_TABLET | ORAL | Status: DC

## 2015-02-01 MED ORDER — CETIRIZINE HCL 10 MG PO TABS: 10.0000 mg | ORAL_TABLET | Freq: Every day | ORAL | Status: DC

## 2015-02-01 NOTE — Progress Notes (Signed)
Patient ID: Kurt Bishop MRN: [id number], DOB: 08/11/1985, 29 y.o. Date of Encounter: 02/01/2015, 9:54 AM    Chief Complaint:  Chief Complaint  Patient presents with  . Sinusitis     HPI: 29 y.o. year old white male says that he has problems with his sinuses every September for the past 4 years. Says it's usually from allergies. Says in the past Dr. Tanya Nones gave him Flonase to have available to use when this happens. Using that but it is not controlling his symptoms. Says that for the past 5 days he has been having a lot of nasal congestion and sinus congestion and pressure. Feels like his entire head is stopped up with a lot of pressure. Is not getting much out of his nose but says that one he does blow it is mostly clear. No fevers or chills. No significant sore throat no chest congestion.     Home Meds:   Outpatient Prescriptions Prior to Visit  Medication Sig Dispense Refill  . acetaminophen (TYLENOL) 500 MG tablet Take 1,000 mg by mouth every 6 (six) hours as needed for headache.    . amphetamine-dextroamphetamine (ADDERALL XR) 30 MG 24 hr capsule Take 1 capsule (30 mg total) by mouth every morning. 30 capsule 0  . fluticasone (FLONASE) 50 MCG/ACT nasal spray Place 2 sprays into both nostrils daily. (Patient taking differently: Place 2 sprays into both nostrils daily as needed for allergies. ) 16 g 6   No facility-administered medications prior to visit.    Allergies:  Allergies  Allergen Reactions  . Amoxicillin Other (See Comments)    When its hot it causes rash       Review of Systems: See HPI for pertinent ROS. All other ROS negative.    Physical Exam: Blood pressure 112/76, pulse 76, temperature 97.4 F (36.3 C), resp. rate 18., There is no weight on file to calculate BMI. General:  Thin white male . Appears in no acute distress. HEENT: Normocephalic, atraumatic, eyes without discharge, sclera non-icteric, nares are without discharge. Bilateral auditory  canals clear, TM's are without perforation, pearly grey and translucent with reflective cone of light bilaterally. Oral cavity moist, posterior pharynx without exudate, erythema, peritonsillar abscess. Minimal tenderness with percussion of sinuses but says that entire sinus and head area feels very congested and lots of pressure.  Neck: Supple. No thyromegaly. No lymphadenopathy. Lungs: Clear bilaterally to auscultation without wheezes, rales, or rhonchi. Breathing is unlabored. Heart: Regular rhythm. No murmurs, rubs, or gallops. Msk:  Strength and tone normal for age. Extremities/Skin: Warm and dry.  Neuro: Alert and oriented X 3. Moves all extremities spontaneously. Gait is normal. CNII-XII grossly in tact. Psych:  Responds to questions appropriately with a normal affect.     ASSESSMENT AND PLAN:  29 y.o. year old male with  1. Acute recurrent sinusitis, unspecified location He is to take the prednisone taper as directed. Cautioned him of possible side effects that people often have with this. Told him that once this gets rid of some of the inflammation and congestion and things start to open up, if he starts having thick dark mucus call us and will add anti-biotic. Also he currently is taking no Zyrtec, Allegra, Xyzal etc. Told him I would send in prescription for Zyrtec, so that if he does start having significant drainage, he can add this as well. Also note given for out of work today that he plans to return to work tomorrow. Follow-up if needed.  - predniSONE (  DELTASONE) 20 MG tablet; Take 3 daily for 2 days, then 2 daily for 2 days, then 1 daily for 2 days.  Dispense: 12 tablet; Refill: 0 - cetirizine (ZYRTEC) 10 MG tablet; Take 1 tablet (10 mg total) by mouth daily.  Dispense: 30 tablet; Refill: [address] [place], [place], [facility] 02/01/2015 9:54 AM

## 2015-02-05 ENCOUNTER — Telehealth: Payer: Self-pay | Admitting: Family Medicine

## 2015-02-05 MED ORDER — AZITHROMYCIN 250 MG PO TABS: ORAL_TABLET | ORAL | Status: DC

## 2015-02-05 NOTE — Telephone Encounter (Signed)
Called home, spoke to wife, aware of RX

## 2015-02-05 NOTE — Telephone Encounter (Signed)
(  FYI: Amoxicillin allergy, so avoid Augmentin) Tell him I will add an anti-biotic:  Azithromycin 250 mg:   Day 1 take 2 daily. Days 2 through 5 take 1 daily.  Dispense #6/1 pack + 0

## 2015-02-05 NOTE — Telephone Encounter (Signed)
[phone] Patient is calling to say that he is not better from last visit and would to know if something can be called in

## 2015-02-19 ENCOUNTER — Ambulatory Visit: Payer: 59

## 2015-02-22 ENCOUNTER — Telehealth: Payer: Self-pay | Admitting: Family Medicine

## 2015-02-22 MED ORDER — AMPHETAMINE-DEXTROAMPHET ER 30 MG PO CP24: 30.0000 mg | ORAL_CAPSULE | ORAL | Status: DC

## 2015-02-22 NOTE — Telephone Encounter (Signed)
ok 

## 2015-02-22 NOTE — Telephone Encounter (Signed)
Patient's wife calling to say that the last time he got his add med filled that the vyvanse was not covered and was prescribed adderall, he is now out and needs refill of this  [phone] (M)

## 2015-02-22 NOTE — Telephone Encounter (Signed)
RX printed x 3 months, left up front and patient aware to pick up  

## 2015-02-22 NOTE — Telephone Encounter (Signed)
?   OK to Refill and ok to do 3 months

## 2015-02-27 ENCOUNTER — Ambulatory Visit: Payer: 59

## 2015-05-20 HISTORY — PX: MULTIPLE TOOTH EXTRACTIONS: SHX2053

## 2015-05-22 ENCOUNTER — Ambulatory Visit (INDEPENDENT_AMBULATORY_CARE_PROVIDER_SITE_OTHER): Payer: BLUE CROSS/BLUE SHIELD | Admitting: Family Medicine

## 2015-05-22 ENCOUNTER — Encounter: Payer: Self-pay | Admitting: Family Medicine

## 2015-05-22 VITALS — BP 110/76 | HR 76 | Temp 98.5°F | Resp 14 | Ht 70.5 in | Wt 136.0 lb

## 2015-05-22 DIAGNOSIS — M25561 Pain in right knee: Secondary | ICD-10-CM

## 2015-05-22 DIAGNOSIS — Z23 Encounter for immunization: Secondary | ICD-10-CM

## 2015-05-22 DIAGNOSIS — F988 Other specified behavioral and emotional disorders with onset usually occurring in childhood and adolescence: Secondary | ICD-10-CM

## 2015-05-22 DIAGNOSIS — F909 Attention-deficit hyperactivity disorder, unspecified type: Secondary | ICD-10-CM | POA: Diagnosis not present

## 2015-05-22 DIAGNOSIS — M25562 Pain in left knee: Secondary | ICD-10-CM | POA: Diagnosis not present

## 2015-05-22 MED ORDER — AMPHETAMINE-DEXTROAMPHET ER 30 MG PO CP24: 30.0000 mg | ORAL_CAPSULE | ORAL | Status: DC

## 2015-05-22 MED ORDER — DICLOFENAC SODIUM 75 MG PO TBEC
75.0000 mg | DELAYED_RELEASE_TABLET | Freq: Two times a day (BID) | ORAL | Status: DC
Start: 2015-05-22 — End: 2016-02-11

## 2015-05-22 NOTE — Progress Notes (Signed)
Subjective:    Patient ID: Kurt Bishop, male    DOB: 18-Jun-1985, 30 y.o.   MRN: [id number]  HPI  11/15 Patient presents today to get a refill on his Adderall XR 30 mg by mouth every morning. Patient has a history of ADHD. Without the medication he has a difficult time focusing at work. He uses the medication to maintain his focus and that he completes his require responsibilities. Without the medication he frequently gets in trouble at work which could cause him to be fired. However on the medication he is able to concentrate, complete his daily tasks, focus at home. He denies any palpitations on the medicine. He denies any chest pain. He denies any anorexia or anxiety.  At that time, my plan was: Patient overall is doing very well. I refill his Adderall XR 30 mg by mouth every morning. I gave him 30 tablets and 2 refills. Recheck in 6 months or as needed. The patient also received his flu shot today in clinic.  01/12/15 Patient is currently taking Adderall XR 30 mg by mouth every morning. He has to take the medication at 5:00 in the morning due to his job. It frequently wears off by 2:00 in the afternoon. He still has 3 or 4 hours of work remaining.  He has met his deductible for the year and he would really like to try to switch back to Vyvanse due to the fact the medication lasted longer and was more consistent throughout the day. He certainly experienced better performance on that medication. He denies any palpitations or chest pain or shortness of breath. He has had no further syncopal episodes similar to the one that he had back in May. At that time, my plan was: Switch Adderall to Vyvanse 60 mg by mouth every morning. I gave the patient 3 months worth of refills. Recheck in 6 months or sooner if necessary.  05/21/14 Here for follow up.  Due for flu shot and has a 10 month old infant at home.  Doing very well on adderally xr 30 mg poqam.  Doesn't want to change medication.   Takes med at  5 am and it lasts till 2-3 PM which is enough.  Denies insomnia, anorexia, or weight loss on med.  Denies anxiety attacks.  Does have poor dentition.  Scheduled to have his teeth pulled and dentures in February but having daily pain.  Also has bilateral knee pain.  Works in Retail buyer.  Drives front-end loader.  By end of the day, knee aches in the joint lines bilaterally. Past Medical History  Diagnosis Date  . ADD (attention deficit disorder)    Current Outpatient Prescriptions on File Prior to Visit  Medication Sig Dispense Refill  . cetirizine (ZYRTEC) 10 MG tablet Take 1 tablet (10 mg total) by mouth daily. 30 tablet 11  . fluticasone (FLONASE) 50 MCG/ACT nasal spray Place 2 sprays into both nostrils daily. (Patient taking differently: Place 2 sprays into both nostrils daily as needed for allergies. ) 16 g 6   No current facility-administered medications on file prior to visit.   Allergies  Allergen Reactions  . Amoxicillin Other (See Comments)    When its hot it causes rash    Social History   Social History  . Marital Status: Married    Spouse Name: N/A  . Number of Children: N/A  . Years of Education: N/A   Occupational History  . Not on file.   Social History Main  Topics  . Smoking status: Former Smoker    Types: Cigarettes  . Smokeless tobacco: Former Systems developer    Quit date: 06/18/2012  . Alcohol Use: No  . Drug Use: No  . Sexual Activity: Not on file   Other Topics Concern  . Not on file   Social History Narrative     Review of Systems  All other systems reviewed and are negative.      Objective:   Physical Exam  Constitutional: He appears well-developed and well-nourished.  HENT:  Mouth/Throat: Abnormal dentition. Dental caries present.  Neck: Neck supple. No JVD present.  Cardiovascular: Normal rate, regular rhythm and normal heart sounds.   No murmur heard. Pulmonary/Chest: Effort normal and breath sounds normal. No respiratory distress. He has no  wheezes. He has no rales.  Abdominal: Soft. Bowel sounds are normal. He exhibits no distension. There is no tenderness. There is no rebound and no guarding.  Musculoskeletal: He exhibits no edema.       Right knee: He exhibits normal range of motion, no swelling, no effusion, no LCL laxity, normal meniscus and no MCL laxity. Tenderness found. Medial joint line and lateral joint line tenderness noted.       Left knee: He exhibits normal range of motion, no swelling, no effusion, no LCL laxity, normal meniscus and no MCL laxity. Tenderness found. Medial joint line and lateral joint line tenderness noted.  Lymphadenopathy:    He has no cervical adenopathy.  Vitals reviewed.         Assessment & Plan:  Knee pain, bilateral - Plan: diclofenac (VOLTAREN) 75 MG EC tablet  ADD (attention deficit disorder)  Continue adderall xr 30 mg poqam.  Gave rx for Jan, Feb, March.  Follow up in 6 months or as needed.  Use diclofenac 75 mg pobid prn  for dental pain and knee pain.  Patient received flu shot today.

## 2015-07-31 ENCOUNTER — Telehealth: Payer: Self-pay | Admitting: Family Medicine

## 2015-07-31 MED ORDER — AMPHETAMINE-DEXTROAMPHET ER 30 MG PO CP24: 30.0000 mg | ORAL_CAPSULE | ORAL | Status: DC

## 2015-07-31 NOTE — Telephone Encounter (Signed)
RX printed x 3 months, left up front and wife will p/u at [facility] today

## 2015-07-31 NOTE — Telephone Encounter (Signed)
?   OK to Refill  

## 2015-07-31 NOTE — Telephone Encounter (Signed)
Patients wife Morrie Sheldonashley has appointment this afternoon and would like to know if she can pick up his adderall rx when she comes in for the appt

## 2015-07-31 NOTE — Telephone Encounter (Signed)
ok 

## 2015-09-07 ENCOUNTER — Ambulatory Visit (INDEPENDENT_AMBULATORY_CARE_PROVIDER_SITE_OTHER): Payer: BLUE CROSS/BLUE SHIELD | Admitting: Family Medicine

## 2015-09-07 ENCOUNTER — Encounter: Payer: Self-pay | Admitting: Family Medicine

## 2015-09-07 VITALS — BP 126/80 | HR 92 | Temp 98.8°F | Resp 18 | Ht 70.5 in | Wt 130.0 lb

## 2015-09-07 DIAGNOSIS — M545 Low back pain, unspecified: Secondary | ICD-10-CM

## 2015-09-07 MED ORDER — PREDNISONE 20 MG PO TABS: ORAL_TABLET | ORAL | Status: DC

## 2015-09-07 MED ORDER — CYCLOBENZAPRINE HCL 10 MG PO TABS: 10.0000 mg | ORAL_TABLET | Freq: Three times a day (TID) | ORAL | Status: DC | PRN

## 2015-09-07 NOTE — Progress Notes (Signed)
Subjective:    Patient ID: Kurt Bishop, male    DOB: 1985-11-04, 30 y.o.   MRN: [id number]  HPI Saturday, the patient was picking up and carrying his children around the transportation museum at [facility]. He denies any specific time where he may have injured his back. He felt fine. However the following morning he developed sudden severe low back pain around the level of L5-S1. The pain does not radiate. It stays right in the paraspinal muscles. However if he bends over he develops sharp sudden severe pain in his lower back. After he sits for 10 or 15 minutes, the mole also become extremely tight and he reports severe stabbing pain if he tries to move.  He has tried over-the-counter NSAIDs with no relief. He denies any radiation of the pain into his legs. He denies any bowel or bladder incontinence. He denies any saddle anesthesia. Muscle strength is 5 over 5 equal and symmetric in both legs with normal reflexes at the patella as well as at the Achilles tendon laterally. He has negative straight leg raise. Past Medical History  Diagnosis Date  . ADD (attention deficit disorder)    Past Surgical History  Procedure Laterality Date  . Inguinal hernia repair  03/24/2012    Procedure: HERNIA REPAIR INGUINAL ADULT;  Surgeon: Fabio Bering, MD;  Location: [facility];  Service: General;  Laterality: Right;   Current Outpatient Prescriptions on File Prior to Visit  Medication Sig Dispense Refill  . [START ON 09/28/2015] amphetamine-dextroamphetamine (ADDERALL XR) 30 MG 24 hr capsule Take 1 capsule (30 mg total) by mouth every morning. 30 capsule 0  . cetirizine (ZYRTEC) 10 MG tablet Take 1 tablet (10 mg total) by mouth daily. 30 tablet 11  . diclofenac (VOLTAREN) 75 MG EC tablet Take 1 tablet (75 mg total) by mouth 2 (two) times daily. 60 tablet 2  . fluticasone (FLONASE) 50 MCG/ACT nasal spray Place 2 sprays into both nostrils daily. (Patient taking differently: Place 2 sprays into  both nostrils daily as needed for allergies. ) 16 g 6   No current facility-administered medications on file prior to visit.   Allergies  Allergen Reactions  . Amoxicillin Other (See Comments)    When its hot it causes rash    Social History   Social History  . Marital Status: Married    Spouse Name: N/A  . Number of Children: N/A  . Years of Education: N/A   Occupational History  . Not on file.   Social History Main Topics  . Smoking status: Former Smoker    Types: Cigarettes  . Smokeless tobacco: Former Neurosurgeon    Quit date: 06/18/2012  . Alcohol Use: No  . Drug Use: No  . Sexual Activity: Not on file   Other Topics Concern  . Not on file   Social History Narrative      Review of Systems  All other systems reviewed and are negative.      Objective:   Physical Exam  Cardiovascular: Normal rate, regular rhythm and normal heart sounds.   Pulmonary/Chest: Effort normal and breath sounds normal.  Musculoskeletal:       Lumbar back: He exhibits decreased range of motion, tenderness, pain and spasm. He exhibits no bony tenderness.  Vitals reviewed.         Assessment & Plan:  Midline low back pain without sciatica - Plan: predniSONE (DELTASONE) 20 MG tablet, cyclobenzaprine (FLEXERIL) 10 MG tablet  I believe the patient  strained the lumbar paraspinal muscles.  However it is also possible he may have herniated a disc in his lower back. Given the severity of his pain, I will start the patient on a prednisone taper pack in addition to Flexeril 10 mg every 8 hours. Recheck next week if no better or sooner if worse. If not getting any better, I will proceed with x-rays lower back however I truly believe this is more likely a strained muscle rather than a skeletal injury.

## 2015-11-05 ENCOUNTER — Telehealth: Payer: Self-pay | Admitting: Family Medicine

## 2015-11-05 NOTE — Telephone Encounter (Signed)
Ok to refill 

## 2015-11-05 NOTE — Telephone Encounter (Signed)
Pt is in need of a refill of adderall xr 30 mg

## 2015-11-06 MED ORDER — AMPHETAMINE-DEXTROAMPHET ER 30 MG PO CP24: 30.0000 mg | ORAL_CAPSULE | ORAL | Status: DC

## 2015-11-06 NOTE — Telephone Encounter (Signed)
ok 

## 2015-11-06 NOTE — Telephone Encounter (Signed)
RX printed, left up front and patient's wife aware to pick up in AM

## 2016-02-11 ENCOUNTER — Ambulatory Visit (INDEPENDENT_AMBULATORY_CARE_PROVIDER_SITE_OTHER): Payer: BLUE CROSS/BLUE SHIELD | Admitting: Family Medicine

## 2016-02-11 ENCOUNTER — Encounter: Payer: Self-pay | Admitting: Family Medicine

## 2016-02-11 VITALS — BP 126/84 | HR 86 | Temp 98.7°F | Resp 14 | Ht 70.5 in | Wt 134.0 lb

## 2016-02-11 DIAGNOSIS — F909 Attention-deficit hyperactivity disorder, unspecified type: Secondary | ICD-10-CM | POA: Diagnosis not present

## 2016-02-11 DIAGNOSIS — F988 Other specified behavioral and emotional disorders with onset usually occurring in childhood and adolescence: Secondary | ICD-10-CM

## 2016-02-11 MED ORDER — AMPHETAMINE-DEXTROAMPHET ER 30 MG PO CP24: 30.0000 mg | ORAL_CAPSULE | ORAL | 0 refills | Status: DC

## 2016-02-11 NOTE — Progress Notes (Signed)
 Subjective:    Patient ID: Kurt Bishop, male    DOB: 02/07/1986, 30 y.o.   MRN: [id number]  HPI 11/15 Patient presents today to get a refill on his Adderall XR 30 mg by mouth every morning. Patient has a history of ADHD. Without the medication he has a difficult time focusing at work. He uses the medication to maintain his focus and that he completes his require responsibilities. Without the medication he frequently gets in trouble at work which could cause him to be fired. However on the medication he is able to concentrate, complete his daily tasks, focus at home. He denies any palpitations on the medicine. He denies any chest pain. He denies any anorexia or anxiety.  At that time, my plan was: Patient overall is doing very well. I refill his Adderall XR 30 mg by mouth every morning. I gave him 30 tablets and 2 refills. Recheck in 6 months or as needed. The patient also received his flu shot today in clinic.  01/12/15 Patient is currently taking Adderall XR 30 mg by mouth every morning. He has to take the medication at 5:00 in the morning due to his job. It frequently wears off by 2:00 in the afternoon. He still has 3 or 4 hours of work remaining.  He has met his deductible for the year and he would really like to try to switch back to Vyvanse due to the fact the medication lasted longer and was more consistent throughout the day. He certainly experienced better performance on that medication. He denies any palpitations or chest pain or shortness of breath. He has had no further syncopal episodes similar to the one that he had back in May. At that time, my plan was: Switch Adderall to Vyvanse 60 mg by mouth every morning. I gave the patient 3 months worth of refills. Recheck in 6 months or sooner if necessary.  05/21/14 Here for follow up.  Due for flu shot and has a 6 month old infant at home.  Doing very well on adderally xr 30 mg poqam.  Doesn't want to change medication.   Takes med at 5  am and it lasts till 2-3 PM which is enough.  Denies insomnia, anorexia, or weight loss on med.  Denies anxiety attacks.  Does have poor dentition.  Scheduled to have his teeth pulled and dentures in February but having daily pain.  Also has bilateral knee pain.  Works in manual labor.  Drives front-end loader.  By end of the day, knee aches in the joint lines bilaterally.  At that time, my plan was: Continue adderall xr 30 mg poqam.  Gave rx for Jan, Feb, March.  Follow up in 6 months or as needed.  Use diclofenac 75 mg pobid prn  for dental pain and knee pain.  Patient received flu shot today.  02/11/16 Patient is here today for refill his Adderall. He takes 30 mg by mouth every morning of Adderall XR. He denies any insomnia. He denies any weight loss. He is doing well at his job. He usually takes the medication around 5:30 in the morning. It will last until 3:30 in the afternoon. He does feel some fatigue and sleepiness as the medication wears off but he does not crash. There is no evidence of abuse or diversion. He denies any depression or anxiety. His wife is scheduled for cholecystectomy tomorrow Past Medical History:  Diagnosis Date  . ADD (attention deficit disorder)    No current   outpatient prescriptions on file prior to visit.   No current facility-administered medications on file prior to visit.    Allergies  Allergen Reactions  . Amoxicillin Other (See Comments)    When its hot it causes rash    Social History   Social History  . Marital status: Married    Spouse name: N/A  . Number of children: N/A  . Years of education: N/A   Occupational History  . Not on file.   Social History Main Topics  . Smoking status: Former Smoker    Types: Cigarettes  . Smokeless tobacco: Former Systems developer    Quit date: 06/18/2012  . Alcohol use No  . Drug use: No  . Sexual activity: Not on file   Other Topics Concern  . Not on file   Social History Narrative  . No narrative on file      Review of Systems  All other systems reviewed and are negative.      Objective:   Physical Exam  Constitutional: He appears well-developed and well-nourished.  HENT:  Mouth/Throat: Abnormal dentition. Dental caries present.  Neck: Neck supple. No JVD present.  Cardiovascular: Normal rate, regular rhythm and normal heart sounds.   No murmur heard. Pulmonary/Chest: Effort normal and breath sounds normal. No respiratory distress. He has no wheezes. He has no rales.  Abdominal: Soft. Bowel sounds are normal. He exhibits no distension. There is no tenderness. There is no rebound and no guarding.  Musculoskeletal: He exhibits no edema.  Lymphadenopathy:    He has no cervical adenopathy.  Vitals reviewed.         Assessment & Plan:  ADD (attention deficit disorder)  Continue Adderall XR 30 mg by mouth every morning. I gave the patient a prescription for September, October, and November. Recheck in 6 months

## 2016-02-25 ENCOUNTER — Encounter: Payer: Self-pay | Admitting: Family Medicine

## 2016-03-03 ENCOUNTER — Ambulatory Visit (INDEPENDENT_AMBULATORY_CARE_PROVIDER_SITE_OTHER): Payer: Worker's Compensation | Admitting: Family Medicine

## 2016-03-03 ENCOUNTER — Encounter: Payer: Self-pay | Admitting: Family Medicine

## 2016-03-03 VITALS — BP 119/80 | HR 83 | Temp 97.3°F | Ht 70.0 in | Wt 132.4 lb

## 2016-03-03 DIAGNOSIS — K4090 Unilateral inguinal hernia, without obstruction or gangrene, not specified as recurrent: Secondary | ICD-10-CM

## 2016-03-03 NOTE — Progress Notes (Signed)
BP 119/80   Pulse 83   Temp 97.3 F (36.3 C) (Oral)   Ht 5\' 10"  (1.778 m)   Wt 132 lb 6 oz (60 kg)   BMI 18.99 kg/m    Subjective:    Patient ID: Kurt Bishop, male    DOB: 1985/12/04, 30 y.o.   MRN: [id number]  HPI: Kurt Bishop is a 30 y.o. male presenting on 03/03/2016 for Groin Pain (left sided, began last week after heavy lifting at work; needs DRUG SCREEN)   HPI Worker's Comp./groin pain Date of injury was 02/28/2016 and he works for [employer]. Patient was working and lifting and pulling heavy material 4 days ago and felt a little bit of a groin twinge then but yesterday really started having pain down in his groin especially with having bowel movements or prolonged sitting. He denies any constipation and is still passing gas. He has had a history of right inguinal hernia repair and was told that he probably had one in his left groin that would develop over time. When at rest he rates the pain as 0 out of 10 but when he is trying to have a bowel movement or lift things or move too much he can get up to a sharp twinges at 5 or 6 out of 10.  Relevant past medical, surgical, family and social history reviewed and updated as indicated. Interim medical history since our last visit reviewed. Allergies and medications reviewed and updated.  Review of Systems  Constitutional: Negative for chills and fever.  Eyes: Negative for discharge.  Respiratory: Negative for shortness of breath and wheezing.   Cardiovascular: Negative for chest pain and leg swelling.  Gastrointestinal: Negative for abdominal distention, abdominal pain, constipation, diarrhea, nausea and vomiting.  Musculoskeletal: Positive for myalgias. Negative for back pain and gait problem.  Skin: Negative for rash.  All other systems reviewed and are negative.   Per HPI unless specifically indicated above     Medication List       Accurate as of 03/03/16  3:29 PM. Always use your most recent med  list.          amphetamine-dextroamphetamine 30 MG 24 hr capsule Commonly known as:  ADDERALL XR Take 1 capsule (30 mg total) by mouth every morning.          Objective:    BP 119/80   Pulse 83   Temp 97.3 F (36.3 C) (Oral)   Ht 5\' 10"  (1.778 m)   Wt 132 lb 6 oz (60 kg)   BMI 18.99 kg/m   Wt Readings from Last 3 Encounters:  03/03/16 132 lb 6 oz (60 kg)  02/11/16 134 lb (60.8 kg)  09/07/15 130 lb (59 kg)    Physical Exam  Constitutional: He is oriented to person, place, and time. He appears well-developed and well-nourished. No distress.  Eyes: Conjunctivae are normal. Right eye exhibits no discharge. No scleral icterus.  Cardiovascular: Normal rate, regular rhythm, normal heart sounds and intact distal pulses.   No murmur heard. Pulmonary/Chest: Effort normal and breath sounds normal. No respiratory distress. He has no wheezes.  Abdominal: Soft. Bowel sounds are normal. He exhibits no distension. There is no tenderness. There is no rebound. A hernia is present. Hernia confirmed positive in the left inguinal area (Very small left inguinal hernia).  Musculoskeletal: Normal range of motion. He exhibits no edema.  Neurological: He is alert and oriented to person, place, and time. Coordination normal.  Skin: Skin is warm  and dry. No rash noted. He is not diaphoretic.  Psychiatric: He has a normal mood and affect. His behavior is normal.  Nursing note and vitals reviewed.     Assessment & Plan:   Problem List Items Addressed This Visit    None    Visit Diagnoses    Left inguinal hernia    -  Primary   Concern for small left inguinal hernia, will refer to general surgery   Relevant Orders   Ambulatory referral to General Surgery      May return to work with restrictions of no lifting more than 30 pounds frequently and no pushing or pulling more than 60 pounds too frequently.  Follow up plan: Return if symptoms worsen or fail to improve.  Counseling provided for  all of the vaccine components Orders Placed This Encounter  Procedures  . Ambulatory referral to General Surgery    Arville CareJoshua Joniqua Sidle, MD [facility] 03/03/2016, 3:29 PM

## 2016-03-04 ENCOUNTER — Ambulatory Visit: Payer: BLUE CROSS/BLUE SHIELD | Admitting: Family Medicine

## 2016-03-13 ENCOUNTER — Telehealth: Payer: Self-pay | Admitting: Family Medicine

## 2016-03-13 NOTE — Telephone Encounter (Signed)
Called patient to advise the general surgery [facility], but I am waiting to hear back from him about if there is a certain office or provider that is preferred for the referral.  Advised patient I will make the appiontment as soon as I find out this information.

## 2016-03-25 ENCOUNTER — Ambulatory Visit: Payer: Self-pay | Admitting: General Surgery

## 2016-03-25 NOTE — H&P (Signed)
History of Present Illness Kurt Bishop(Kurt Reen MD; 03/25/2016 2:54 PM) The patient is a 30 year old male who presents with an inguinal hernia. The patient is a 30 year old male who is referred by Dr. Ivin BootyJoshua Bishop for evaluation of a left inguinal hernia. Patient states that approximately a month ago he noticed a bulge and firmness to the left inguinal area. The patient had a previous right inguinal hernia repaired in open fashion several years ago. 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  Patient has not had any signs of incarceration or strangulation.   Other Problems Kurt Bishop(Sade [place], [place]; 03/25/2016 2:07 PM) Inguinal Hernia  Past Surgical History Kurt Bishop(Sade [place], [place]; 03/25/2016 2:07 PM) Laparoscopic Inguinal Hernia Surgery Right. Oral Surgery  Diagnostic Studies History Kurt Bishop(Sade [place], [place]; 03/25/2016 2:07 PM) Colonoscopy never  Allergies Kurt Bishop(Sade [place], [place]; 03/25/2016 2:10 PM) Amoxicillin *PENICILLINS*  Medication History Kurt Bishop(Sade Bradford, CMA; 03/25/2016 2:10 PM) Adderall XR (30MG  Capsule ER 24HR, Oral) Active. Medications Reconciled  Social History Kurt Bishop(Sade [place], [place]; 03/25/2016 2:07 PM) Caffeine use Carbonated beverages. No alcohol use No drug use Tobacco use Former smoker.  Family History Kurt Bishop(Sade [place], [place]; 03/25/2016 2:07 PM) Migraine Headache Mother.    Review of Systems Kurt Bishop(Sade Bradford CMA; 03/25/2016 2:07 PM) General Not Present- Appetite Loss, Chills, Fatigue, Fever, Night Sweats, Weight Gain and Weight Loss. Skin Not Present- Change in Wart/Mole, Dryness, Hives, Jaundice, New Lesions, Non-Healing Wounds, Rash and Ulcer. HEENT Not Present- Earache, Hearing Loss, Hoarseness, Nose Bleed, Oral Ulcers, Ringing in the Ears, Seasonal Allergies, Sinus Pain, Sore Throat, Visual Disturbances, Wears glasses/contact lenses and Yellow Eyes. Respiratory Not Present- Bloody sputum, Chronic Cough, Difficulty Breathing, Snoring and Wheezing. Breast  Not Present- Breast Mass, Breast Pain, Nipple Discharge and Skin Changes. Cardiovascular Not Present- Chest Pain, Difficulty Breathing Lying Down, Leg Cramps, Palpitations, Rapid Heart Rate, Shortness of Breath and Swelling of Extremities. Gastrointestinal Present- Abdominal Pain. Not Present- Bloating, Bloody Stool, Change in Bowel Habits, Chronic diarrhea, Constipation, Difficulty Swallowing, Excessive gas, Gets full quickly at meals, Hemorrhoids, Indigestion, Nausea, Rectal Pain and Vomiting. Male Genitourinary Not Present- Blood in Urine, Change in Urinary Stream, Frequency, Impotence, Nocturia, Painful Urination, Urgency and Urine Leakage. Musculoskeletal Not Present- Back Pain, Joint Pain, Joint Stiffness, Muscle Pain, Muscle Weakness and Swelling of Extremities. Neurological Not Present- Decreased Memory, Fainting, Headaches, Numbness, Seizures, Tingling, Tremor, Trouble walking and Weakness. Psychiatric Not Present- Anxiety, Bipolar, Change in Sleep Pattern, Depression, Fearful and Frequent crying. Endocrine Not Present- Cold Intolerance, Excessive Hunger, Hair Changes, Heat Intolerance, Hot flashes and New Diabetes. Hematology Not Present- Blood Thinners, Easy Bruising, Excessive bleeding, Gland problems, HIV and Persistent Infections.  Vitals Kurt Bishop(Sade Bradford CMA; 03/25/2016 2:11 PM) 03/25/2016 2:10 PM Weight: 134.6 lb Height: 70in Body Surface Area: 1.76 m Body Mass Index: 19.31 kg/m  Temp.: 98.20F  Pulse: 104 (Regular)  BP: 124/84 (Sitting, Left Arm, Standard)       Physical Exam Kurt Bishop(Vianey Caniglia, MD; 03/25/2016 2:54 PM) General Mental Status-Alert. General Appearance-Consistent with stated age. Hydration-Well hydrated. Voice-Normal.  Head and Neck Head-normocephalic, atraumatic with no lesions or palpable masses. Trachea-midline.  Eye Eyeball - Bilateral-Extraocular movements intact. Sclera/Conjunctiva - Bilateral-No scleral icterus.  Chest  and Lung Exam Chest and lung exam reveals -quiet, even and easy respiratory effort with no use of accessory muscles. Inspection Chest Wall - Normal. Back - normal.  Cardiovascular Cardiovascular examination reveals -normal heart sounds, regular rate and rhythm with no murmurs.  Abdomen Inspection Skin - Scar - no surgical scars.  Hernias - Inguinal hernia - Left - Reducible. Palpation/Percussion Normal exam - Soft, Non Tender, No Rebound tenderness, No Rigidity (guarding) and No hepatosplenomegaly. Auscultation Normal exam - Bowel sounds normal.  Neurologic Neurologic evaluation reveals -alert and oriented x 3 with no impairment of recent or remote memory. Mental Status-Normal.  Musculoskeletal Normal Exam - Left-Upper Extremity Strength Normal and Lower Extremity Strength Normal. Normal Exam - Right-Upper Extremity Strength Normal, Lower Extremity Weakness.    Assessment & Plan Kurt Bishop(Khadar Monger MD; 03/25/2016 2:54 PM) LEFT INGUINAL HERNIA (K40.90) Impression: 30 year old male with a left inguinal hernia  1. The patient will like to proceed to the operating room for laparoscopic left inguinal hernia repair with mesh.  2. I discussed with the patient the signs and symptoms of incarceration and strangulation and the need to proceed to the ER should they occur.  3. I discussed with the patient the risks and benefits of the procedure to include but not limited to: Infection, bleeding, damage to surrounding structures, possible need for further surgery, possible nerve pain, and possible recurrence. The patient was understanding and wishes to proceed.

## 2016-04-23 ENCOUNTER — Encounter (HOSPITAL_COMMUNITY)
Admission: RE | Admit: 2016-04-23 | Discharge: 2016-04-23 | Disposition: A | Discharge status: Home / Self Care | Payer: Self-pay | Source: Ambulatory Visit | Attending: General Surgery | Admitting: General Surgery

## 2016-04-23 ENCOUNTER — Encounter (HOSPITAL_COMMUNITY): Payer: Self-pay

## 2016-04-23 DIAGNOSIS — Z01812 Encounter for preprocedural laboratory examination: Secondary | ICD-10-CM | POA: Insufficient documentation

## 2016-04-23 DIAGNOSIS — K4090 Unilateral inguinal hernia, without obstruction or gangrene, not specified as recurrent: Secondary | ICD-10-CM | POA: Insufficient documentation

## 2016-04-23 HISTORY — DX: Adverse effect of unspecified anesthetic, initial encounter: T41.45XA

## 2016-04-23 HISTORY — DX: Other complications of anesthesia, initial encounter: T88.59XA

## 2016-04-23 HISTORY — DX: Syncope and collapse: R55

## 2016-04-23 HISTORY — DX: Headache, unspecified: R51.9

## 2016-04-23 HISTORY — DX: Headache: R51

## 2016-04-23 HISTORY — DX: Unspecified convulsions: R56.9

## 2016-04-23 HISTORY — DX: Family history of other specified conditions: Z84.89

## 2016-04-23 LAB — BASIC METABOLIC PANEL
ANION GAP: 9 (ref 5–15)
BUN: 10 mg/dL (ref 6–20)
CALCIUM: 9.7 mg/dL (ref 8.9–10.3)
CHLORIDE: 101 mmol/L (ref 101–111)
CO2: 26 mmol/L (ref 22–32)
Creatinine, Ser: 1.06 mg/dL (ref 0.61–1.24)
GFR calc non Af Amer: >60 mL/min (ref >60)
Glucose, Bld: 105 mg/dL — ABNORMAL HIGH (ref 65–99)
Potassium: 4.1 mmol/L (ref 3.5–5.1)
Sodium: 136 mmol/L (ref 135–145)

## 2016-04-23 LAB — CBC
HCT: 43.8 % (ref 39.0–52.0)
HEMOGLOBIN: 15.6 g/dL (ref 13.0–17.0)
MCH: 28.2 pg (ref 26.0–34.0)
MCHC: 35.6 g/dL (ref 30.0–36.0)
MCV: 79.2 fL (ref 78.0–100.0)
Platelets: 206 10*3/uL (ref 150–400)
RBC: 5.53 MIL/uL (ref 4.22–5.81)
RDW: 12.4 % (ref 11.5–15.5)
WBC: 5.4 10*3/uL (ref 4.0–10.5)

## 2016-04-23 NOTE — Pre-Procedure Instructions (Signed)
Zoe LanChristopher F Lirette  04/23/2016      [facility] - [place], [place] - [address] [place] [address] Phone: [phone] Fax: [phone]  [facility] - [place], [place] - [address] [place] [address] Phone: [phone] Fax: [phone]    Your procedure is scheduled on Tuesday, December 12th, 2017.  [facility].   Call this number if you have problems the morning of surgery:  [phone]   Remember:  Do not eat food or drink liquids after midnight.   Take these medicines the morning of surgery with A SIP OF WATER: None.  Stop taking: Aspirin-acetaminophen-caffeine (Excedrin Migraine), Aspirin, NSAIDS, Aleve, Naproxen, Ibuprofen, Advil, Motrin, BC's, Goody's, Fish oil, all herbal medications, and all vitamins.    Do not wear jewelry.  Do not wear lotions, powders, or colognes, or deoderant.  Men may shave face and neck.  Do not bring valuables to the hospital.  [facility] is not responsible for any belongings or valuables.  Contacts, dentures or bridgework may not be worn into surgery.  Leave your suitcase in the car.  After surgery it may be brought to your room.  For patients admitted to the hospital, discharge time will be determined by your treatment team.  Patients discharged the day of surgery will not be allowed to drive home.   Special instructions:  Preparing for Surgery.   [facility]- Preparing For Surgery  Before surgery, you can play an important role. Because skin is not sterile, your skin needs to be as free of germs as possible. You can reduce the number of germs on your skin by washing with CHG (chlorahexidine gluconate) Soap before surgery.  CHG is an antiseptic cleaner which kills germs and bonds with the skin to continue killing germs even after washing.  Please do not use if you have an allergy to CHG or antibacterial soaps. If  your skin becomes reddened/irritated stop using the CHG.  Do not shave (including legs and underarms) for at least 48 hours prior to first CHG shower. It is OK to shave your face.  Please follow these instructions carefully.   1. Shower the NIGHT BEFORE SURGERY and the MORNING OF SURGERY with CHG.   2. If you chose to wash your hair, wash your hair first as usual with your normal shampoo.  3. After you shampoo, rinse your hair and body thoroughly to remove the shampoo.  4. Use CHG as you would any other liquid soap. You can apply CHG directly to the skin and wash gently with a scrungie or a clean washcloth.   5. Apply the CHG Soap to your body ONLY FROM THE NECK DOWN.  Do not use on open wounds or open sores. Avoid contact with your eyes, ears, mouth and genitals (private parts). Wash genitals (private parts) with your normal soap.  6. Wash thoroughly, paying special attention to the area where your surgery will be performed.  7. Thoroughly rinse your body with warm water from the neck down.  8. DO NOT shower/wash with your normal soap after using and rinsing off the CHG Soap.  9. Pat yourself dry with a CLEAN TOWEL.   10. Wear CLEAN PAJAMAS   11. Place CLEAN SHEETS on your bed the night of your first shower and DO NOT SLEEP WITH PETS.  Day of Surgery: Do not apply any deodorants/lotions. Please wear clean clothes  to the hospital/surgery center.     Please read over the following fact sheets that you were given. Coughing and Deep Breathing and Surgical Site Infection Prevention

## 2016-04-23 NOTE — Progress Notes (Signed)
Pt. Follow[facility].  Most significant medical history is surrounding his history of "passing out post op" & other episodes of what he & his wife have been told are vasovagal responses.

## 2016-04-29 ENCOUNTER — Encounter (HOSPITAL_COMMUNITY)
Admission: RE | Disposition: A | Discharge status: Home / Self Care | Payer: Self-pay | Source: Ambulatory Visit | Attending: General Surgery

## 2016-04-29 ENCOUNTER — Encounter (HOSPITAL_COMMUNITY): Payer: Self-pay | Admitting: *Deleted

## 2016-04-29 ENCOUNTER — Ambulatory Visit (HOSPITAL_COMMUNITY)
Admission: RE | Admit: 2016-04-29 | Discharge: 2016-04-29 | Disposition: A | Discharge status: Home / Self Care | Payer: Worker's Compensation | Source: Ambulatory Visit | Attending: General Surgery | Admitting: General Surgery

## 2016-04-29 ENCOUNTER — Ambulatory Visit (HOSPITAL_COMMUNITY): Payer: Worker's Compensation | Admitting: Anesthesiology

## 2016-04-29 DIAGNOSIS — K4090 Unilateral inguinal hernia, without obstruction or gangrene, not specified as recurrent: Secondary | ICD-10-CM | POA: Insufficient documentation

## 2016-04-29 DIAGNOSIS — Z880 Allergy status to penicillin: Secondary | ICD-10-CM | POA: Insufficient documentation

## 2016-04-29 DIAGNOSIS — Z87891 Personal history of nicotine dependence: Secondary | ICD-10-CM | POA: Diagnosis not present

## 2016-04-29 DIAGNOSIS — Z820 Family history of epilepsy and other diseases of the nervous system: Secondary | ICD-10-CM | POA: Insufficient documentation

## 2016-04-29 HISTORY — PX: INSERTION OF MESH: SHX5868

## 2016-04-29 HISTORY — PX: INGUINAL HERNIA REPAIR: SHX194

## 2016-04-29 SURGERY — REPAIR, HERNIA, INGUINAL, LAPAROSCOPIC
Anesthesia: General | Site: Groin | Laterality: Left

## 2016-04-29 MED ORDER — VANCOMYCIN HCL IN DEXTROSE 1-5 GM/200ML-% IV SOLN
INTRAVENOUS | Status: AC
  Filled 2016-04-29: qty 200

## 2016-04-29 MED ORDER — FENTANYL CITRATE (PF) 100 MCG/2ML IJ SOLN
INTRAMUSCULAR | Status: DC | PRN
  Administered 2016-04-29: 100 ug via INTRAVENOUS

## 2016-04-29 MED ORDER — HYDROCODONE-ACETAMINOPHEN 5-325 MG PO TABS: 1.0000 | ORAL_TABLET | Freq: Four times a day (QID) | ORAL | 0 refills | Status: DC | PRN

## 2016-04-29 MED ORDER — CHLORHEXIDINE GLUCONATE CLOTH 2 % EX PADS: 6.0000 | MEDICATED_PAD | Freq: Once | CUTANEOUS | Status: DC

## 2016-04-29 MED ORDER — ONDANSETRON HCL 4 MG/2ML IJ SOLN
INTRAMUSCULAR | Status: AC
  Filled 2016-04-29: qty 2

## 2016-04-29 MED ORDER — SUGAMMADEX SODIUM 200 MG/2ML IV SOLN
INTRAVENOUS | Status: AC
  Filled 2016-04-29: qty 2

## 2016-04-29 MED ORDER — SUGAMMADEX SODIUM 200 MG/2ML IV SOLN
INTRAVENOUS | Status: DC | PRN
  Administered 2016-04-29: 200 mg via INTRAVENOUS

## 2016-04-29 MED ORDER — HYDROCODONE-ACETAMINOPHEN 5-325 MG PO TABS
1.0000 | ORAL_TABLET | Freq: Once | ORAL | Status: AC
  Administered 2016-04-29: 1 via ORAL

## 2016-04-29 MED ORDER — CEFAZOLIN SODIUM-DEXTROSE 2-4 GM/100ML-% IV SOLN
INTRAVENOUS | Status: AC
  Filled 2016-04-29: qty 100

## 2016-04-29 MED ORDER — ONDANSETRON HCL 4 MG/2ML IJ SOLN
4.0000 mg | Freq: Once | INTRAMUSCULAR | Status: AC
  Administered 2016-04-29: 4 mg via INTRAVENOUS

## 2016-04-29 MED ORDER — EPHEDRINE 5 MG/ML INJ
INTRAVENOUS | Status: AC
  Filled 2016-04-29: qty 10

## 2016-04-29 MED ORDER — PROPOFOL 10 MG/ML IV BOLUS
INTRAVENOUS | Status: DC | PRN
  Administered 2016-04-29: 200 mg via INTRAVENOUS

## 2016-04-29 MED ORDER — ONDANSETRON HCL 4 MG/2ML IJ SOLN
INTRAMUSCULAR | Status: DC | PRN
  Administered 2016-04-29: 4 mg via INTRAVENOUS

## 2016-04-29 MED ORDER — LACTATED RINGERS IV SOLN
INTRAVENOUS | Status: DC
  Administered 2016-04-29 (×2): via INTRAVENOUS

## 2016-04-29 MED ORDER — LIDOCAINE 2% (20 MG/ML) 5 ML SYRINGE
INTRAMUSCULAR | Status: AC
  Filled 2016-04-29: qty 5

## 2016-04-29 MED ORDER — BUPIVACAINE HCL (PF) 0.25 % IJ SOLN
INTRAMUSCULAR | Status: AC
  Filled 2016-04-29: qty 30

## 2016-04-29 MED ORDER — HYDROCODONE-ACETAMINOPHEN 5-325 MG PO TABS
ORAL_TABLET | ORAL | Status: AC
  Filled 2016-04-29: qty 1

## 2016-04-29 MED ORDER — FENTANYL CITRATE (PF) 100 MCG/2ML IJ SOLN
INTRAMUSCULAR | Status: AC
  Filled 2016-04-29: qty 2

## 2016-04-29 MED ORDER — ROCURONIUM BROMIDE 100 MG/10ML IV SOLN
INTRAVENOUS | Status: DC | PRN
  Administered 2016-04-29: 10 mg via INTRAVENOUS
  Administered 2016-04-29: 30 mg via INTRAVENOUS

## 2016-04-29 MED ORDER — LIDOCAINE HCL (CARDIAC) 20 MG/ML IV SOLN
INTRAVENOUS | Status: DC | PRN
  Administered 2016-04-29: 60 mg via INTRAVENOUS

## 2016-04-29 MED ORDER — CEFAZOLIN SODIUM-DEXTROSE 2-4 GM/100ML-% IV SOLN: 2.0000 g | INTRAVENOUS | Status: DC

## 2016-04-29 MED ORDER — GLYCOPYRROLATE 0.2 MG/ML IJ SOLN
INTRAMUSCULAR | Status: DC | PRN
  Administered 2016-04-29: 0.2 mg via INTRAVENOUS

## 2016-04-29 MED ORDER — FENTANYL CITRATE (PF) 100 MCG/2ML IJ SOLN
25.0000 ug | INTRAMUSCULAR | Status: DC | PRN
  Administered 2016-04-29: 25 ug via INTRAVENOUS

## 2016-04-29 MED ORDER — MIDAZOLAM HCL 5 MG/5ML IJ SOLN
INTRAMUSCULAR | Status: DC | PRN
  Administered 2016-04-29: 2 mg via INTRAVENOUS

## 2016-04-29 MED ORDER — 0.9 % SODIUM CHLORIDE (POUR BTL) OPTIME
TOPICAL | Status: DC | PRN
  Administered 2016-04-29: 1000 mL

## 2016-04-29 MED ORDER — VANCOMYCIN HCL IN DEXTROSE 1-5 GM/200ML-% IV SOLN
1000.0000 mg | INTRAVENOUS | Status: AC
  Administered 2016-04-29: 1000 mg via INTRAVENOUS

## 2016-04-29 MED ORDER — ROCURONIUM BROMIDE 50 MG/5ML IV SOSY
PREFILLED_SYRINGE | INTRAVENOUS | Status: AC
  Filled 2016-04-29: qty 5

## 2016-04-29 MED ORDER — GLYCOPYRROLATE 0.2 MG/ML IV SOSY
PREFILLED_SYRINGE | INTRAVENOUS | Status: AC
  Filled 2016-04-29: qty 3

## 2016-04-29 MED ORDER — PROPOFOL 10 MG/ML IV BOLUS
INTRAVENOUS | Status: AC
  Filled 2016-04-29: qty 20

## 2016-04-29 MED ORDER — MIDAZOLAM HCL 2 MG/2ML IJ SOLN
INTRAMUSCULAR | Status: AC
  Filled 2016-04-29: qty 2

## 2016-04-29 MED ORDER — EPHEDRINE SULFATE-NACL 50-0.9 MG/10ML-% IV SOSY
PREFILLED_SYRINGE | INTRAVENOUS | Status: DC | PRN
  Administered 2016-04-29: 10 mg via INTRAVENOUS

## 2016-04-29 MED ORDER — BUPIVACAINE HCL 0.25 % IJ SOLN
INTRAMUSCULAR | Status: DC | PRN
  Administered 2016-04-29: 4 mL

## 2016-04-29 SURGICAL SUPPLY — 48 items
APL SKNCLS STERI-STRIP NONHPOA (GAUZE/BANDAGES/DRESSINGS) ×1
APPLIER CLIP 5 13 M/L LIGAMAX5 (MISCELLANEOUS)
APR CLP MED LRG 5 ANG JAW (MISCELLANEOUS)
BENZOIN TINCTURE PRP APPL 2/3 (GAUZE/BANDAGES/DRESSINGS) ×3 IMPLANT
CANISTER SUCTION 2500CC (MISCELLANEOUS) IMPLANT
CHLORAPREP W/TINT 26ML (MISCELLANEOUS) ×3 IMPLANT
CLIP APPLIE 5 13 M/L LIGAMAX5 (MISCELLANEOUS) IMPLANT
CLOSURE WOUND 1/2 X4 (GAUZE/BANDAGES/DRESSINGS) ×1
COVER SURGICAL LIGHT HANDLE (MISCELLANEOUS) ×3 IMPLANT
DISSECTOR BLUNT TIP ENDO 5MM (MISCELLANEOUS) IMPLANT
ELECT REM PT RETURN 9FT ADLT (ELECTROSURGICAL) ×3
ELECTRODE REM PT RTRN 9FT ADLT (ELECTROSURGICAL) ×1 IMPLANT
ENDOLOOP SUT PDS II  0 18 (SUTURE) ×2
ENDOLOOP SUT PDS II 0 18 (SUTURE) IMPLANT
GAUZE SPONGE 2X2 8PLY STRL LF (GAUZE/BANDAGES/DRESSINGS) ×1 IMPLANT
GLOVE BIO SURGEON STRL SZ7.5 (GLOVE) ×3 IMPLANT
GOWN STRL REUS W/ TWL LRG LVL3 (GOWN DISPOSABLE) ×2 IMPLANT
GOWN STRL REUS W/ TWL XL LVL3 (GOWN DISPOSABLE) ×1 IMPLANT
GOWN STRL REUS W/TWL LRG LVL3 (GOWN DISPOSABLE) ×6
GOWN STRL REUS W/TWL XL LVL3 (GOWN DISPOSABLE) ×3
KIT BASIN OR (CUSTOM PROCEDURE TRAY) ×3 IMPLANT
KIT ROOM TURNOVER OR (KITS) ×3 IMPLANT
MESH 3DMAX 4X6 LT LRG (Mesh General) ×2 IMPLANT
NDL INSUFFLATION 14GA 120MM (NEEDLE) IMPLANT
NEEDLE INSUFFLATION 14GA 120MM (NEEDLE) IMPLANT
NS IRRIG 1000ML POUR BTL (IV SOLUTION) ×3 IMPLANT
PAD ARMBOARD 7.5X6 YLW CONV (MISCELLANEOUS) ×6 IMPLANT
RELOAD STAPLE 4.0 BLU F/HERNIA (INSTRUMENTS) ×1 IMPLANT
RELOAD STAPLE 4.8 BLK F/HERNIA (STAPLE) IMPLANT
RELOAD STAPLE HERNIA 4.0 BLUE (INSTRUMENTS) ×3 IMPLANT
RELOAD STAPLE HERNIA 4.8 BLK (STAPLE) IMPLANT
SCISSORS LAP 5X35 DISP (ENDOMECHANICALS) ×3 IMPLANT
SET IRRIG TUBING LAPAROSCOPIC (IRRIGATION / IRRIGATOR) IMPLANT
SET TROCAR LAP APPLE-HUNT 5MM (ENDOMECHANICALS) ×3 IMPLANT
SPONGE GAUZE 2X2 STER 10/PKG (GAUZE/BANDAGES/DRESSINGS) ×2
STAPLER HERNIA 12 8.5 360D (INSTRUMENTS) ×3 IMPLANT
STRIP CLOSURE SKIN 1/2X4 (GAUZE/BANDAGES/DRESSINGS) ×2 IMPLANT
SUT MNCRL AB 4-0 PS2 18 (SUTURE) ×3 IMPLANT
SUT VIC AB 1 CT1 27 (SUTURE)
SUT VIC AB 1 CT1 27XBRD ANBCTR (SUTURE) IMPLANT
SYRINGE TOOMEY DISP (SYRINGE) ×1 IMPLANT
TOWEL OR 17X24 6PK STRL BLUE (TOWEL DISPOSABLE) ×3 IMPLANT
TOWEL OR 17X26 10 PK STRL BLUE (TOWEL DISPOSABLE) ×3 IMPLANT
TRAY FOLEY CATH SILVER 14FR (SET/KITS/TRAYS/PACK) ×1 IMPLANT
TRAY LAPAROSCOPIC MC (CUSTOM PROCEDURE TRAY) ×3 IMPLANT
TROCAR XCEL 12X100 BLDLESS (ENDOMECHANICALS) ×3 IMPLANT
TUBING INSUFFLATION (TUBING) ×3 IMPLANT
WATER STERILE IRR 1000ML POUR (IV SOLUTION) ×1 IMPLANT

## 2016-04-29 NOTE — Discharge Instructions (Signed)
[facility] _______[facility] °INGUINAL HERNIA REPAIR: POST OP INSTRUCTIONS ° °Always review your discharge instruction sheet given to you by the facility where your surgery was performed. °IF YOU HAVE DISABILITY OR FAMILY LEAVE FORMS, YOU MUST BRING THEM TO THE OFFICE FOR PROCESSING.   °DO NOT GIVE THEM TO YOUR DOCTOR. ° °1. A  prescription for pain medication may be given to you upon discharge.  Take your pain medication as prescribed, if needed.  If narcotic pain medicine is not needed, then you may take acetaminophen (Tylenol) or ibuprofen (Advil) as needed. °2. Take your usually prescribed medications unless otherwise directed. °If you need a refill on your pain medication, please contact your pharmacy.  They will contact our office to request authorization. Prescriptions will not be filled after 5 pm or on week-ends. °3. You should follow a light diet the first 24 hours after arrival home, such as soup and crackers, etc.  Be sure to include lots of fluids daily.  Resume your normal diet the day after surgery. °4.Most patients will experience some swelling and bruising around the umbilicus or in the groin and scrotum.  Ice packs and reclining will help.  Swelling and bruising can take several days to resolve.  °6. It is common to experience some constipation if taking pain medication after surgery.  Increasing fluid intake and taking a stool softener (such as Colace) will usually help or prevent this problem from occurring.  A mild laxative (Milk of Magnesia or Miralax) should be taken according to package directions if there are no bowel movements after 48 hours. °7. Unless discharge instructions indicate otherwise, you may remove your bandages 24-48 hours after surgery, and you may shower at that time.  You may have steri-strips (small skin tapes) in place directly over the incision.  These strips should be left on the skin for 7-10 days.  If your surgeon used skin glue on the incision, you may  shower in 24 hours.  The glue will flake off over the next 2-3 weeks.  Any sutures or staples will be removed at the office during your follow-up visit. °8. ACTIVITIES:  You may resume regular (light) daily activities beginning the next day--such as daily self-care, walking, climbing stairs--gradually increasing activities as tolerated.  You may have sexual intercourse when it is comfortable.  Refrain from any heavy lifting or straining until approved by your doctor. ° °a.You may drive when you are no longer taking prescription pain medication, you can comfortably wear a seatbelt, and you can safely maneuver your car and apply brakes. °b.RETURN TO WORK:   °_____________________________________________ ° °9.You should see your doctor in the office for a follow-up appointment approximately 2-3 weeks after your surgery.  Make sure that you call for this appointment within a day or two after you arrive home to insure a convenient appointment time. °10.OTHER INSTRUCTIONS: _________________________ °   _____________________________________ ° °WHEN TO CALL YOUR DOCTOR: °1. Fever over 101.0 °2. Inability to urinate °3. Nausea and/or vomiting °4. Extreme swelling or bruising °5. Continued bleeding from incision. °6. Increased pain, redness, or drainage from the incision ° °The clinic staff is available to answer your questions during regular business hours.  Please don’t hesitate to call and ask to speak to one of the nurses for clinical concerns.  If you have a medical emergency, go to the nearest emergency room or call 911.  A surgeon from [facility] is always on call at the hospital ° ° °[address], [address], [place], [place]  [address] ? ° [address] [address], [place], [place]   [address] °[phone] ? [phone] ? FAX [phone] °Web site: [email or web address] ° °

## 2016-04-29 NOTE — Anesthesia Procedure Notes (Signed)
Procedure Name: Intubation Date/Time: 04/29/2016 9:10 AM Performed by: Quentin OreWALKER, Munir Victorian E Pre-anesthesia Checklist: Patient identified, Emergency Drugs available, Suction available and Patient being monitored Patient Re-evaluated:Patient Re-evaluated prior to inductionPreoxygenation: Pre-oxygenation with 100% oxygen Intubation Type: IV induction Ventilation: Mask ventilation without difficulty Laryngoscope Size: Mac and 4 Grade View: Grade I Tube type: Oral Tube size: 7.5 mm Number of attempts: 1 Airway Equipment and Method: Stylet Placement Confirmation: ETT inserted through vocal cords under direct vision,  positive ETCO2 and breath sounds checked- equal and bilateral Secured at: 22 cm Dental Injury: Teeth and Oropharynx as per pre-operative assessment

## 2016-04-29 NOTE — Anesthesia Postprocedure Evaluation (Signed)
Anesthesia Post Note  Patient: Kurt Bishop  Procedure(s) Performed: Procedure(s) (LRB): LAPAROSCOPIC LEFT INGUINAL HERNIA REPAIR WITH MESH (Left) INSERTION OF MESH (Left)  Patient location during evaluation: PACU Anesthesia Type: General Level of consciousness: awake Pain management: pain level controlled Vital Signs Assessment: post-procedure vital signs reviewed and stable Cardiovascular status: stable Anesthetic complications: no    Last Vitals:  Vitals:   04/29/16 1143 04/29/16 1145  BP:    Pulse: (!) 51 (!) 57  Resp: 15 18  Temp:      Last Pain:  Vitals:   04/29/16 1143  TempSrc:   PainSc: 3                  Kurt Bishop

## 2016-04-29 NOTE — Op Note (Signed)
04/29/2016  9:57 AM  PATIENT:  Kurt Bishop  30 y.o. male  PRE-OPERATIVE DIAGNOSIS:  Left inguinal hernia  POST-OPERATIVE DIAGNOSIS:  Left INDIRECT inguinal hernia  PROCEDURE:  Procedure(s): LAPAROSCOPIC LEFT INGUINAL HERNIA REPAIR WITH MESH (Left) INSERTION OF MESH (Left)  SURGEON:  Surgeon(s) and Role:    * Axel FillerArmando Kimberley Speece, MD - Primary  ANESTHESIA:   local and general  EBL:  <5cc  BLOOD ADMINISTERED:none  DRAINS: none   LOCAL MEDICATIONS USED:  BUPIVICAINE   SPECIMEN:  No Specimen  DISPOSITION OF SPECIMEN:  N/A  COUNTS:  YES  TOURNIQUET:  * No tourniquets in log *  DICTATION: .Dragon Dictation   Counts: reported as correct x 2  Findings:  The patient had a small left indirect hernia  Indications for procedure:  The patient is a 30 year old male with a left hernia for several months. Patient complained of symptomatology to his left inguinal area. The patient was taken back for elective inguinal hernia repair.  Details of the procedure: The patient was taken back to the operating room. The patient was placed in supine position with bilateral SCDs in place.  The patient was prepped and draped in the usual sterile fashion.  After appropriate anitbiotics were confirmed, a time-out was confirmed and all facts were verified.  0.25% Marcaine was used to infiltrate the umbilical area. A 11-blade was used to cut down the skin and blunt dissection was used to get the anterior fashion.  The anterior fascia was incised approximately 1 cm and the muscles were retracted laterally. Blunt dissection was then used to create a space in the preperitoneal area. At this time a 10 mm camera was then introduced into the space and advanced the pubic tubercle and a 12 mm trocar was placed over this and insufflation was started.  At this time and space was created from medial to laterally the preperitoneal space.  Cooper's ligament was initially cleaned off.  The hernia sac was  identified in the indirect space. Dissection of the spermatic cord was undertaken the vas deferens was identified and protected in all parts of the case.  There was a small tear into the hernia sac.  An Endoloop was used to ligate the hole.  Once the hernia sac was taken down to approximately the umbilicus a Bard 3D Max mesh, size: Large, was  introduced into the preperitoneal space.  The mesh was brought over to cover the direct and indirect hernia spaces.  This was anchored into place and secured to Cooper's ligament with 4.400mm staples from a Coviden hernia stapler. It was anchored to the anterior abdominal wall with 4.8 mm staples. The hernia sac was seen lying posterior to the mesh. There was no staples placed laterally. The insufflation was evacuated and the peritoneum was seen posterior to the mesh. The trochars were removed. The anterior fascia was reapproximated using #1 Vicryl on a UR- 6.  Intra-abdominal air was evacuated and the Veress needle removed. The skin was reapproximated using 4-0 Monocryl subcuticular fashion the patient was awakened from general anesthesia and taken to recovery in stable condition.   PLAN OF CARE: Discharge to home after PACU  PATIENT DISPOSITION:  PACU - hemodynamically stable.   Delay start of Pharmacological VTE agent (>24hrs) due to surgical blood loss or risk of bleeding: not applicable

## 2016-04-29 NOTE — Anesthesia Preprocedure Evaluation (Addendum)
Anesthesia Evaluation  Patient identified by MRN, date of birth, ID band Patient awake    Reviewed: Allergy & Precautions, NPO status , Patient's Chart, lab work & pertinent test results  Airway Mallampati: II  TM Distance: >3 FB     Dental  (+) Edentulous Upper, Edentulous Lower   Pulmonary Current Smoker,    breath sounds clear to auscultation       Cardiovascular negative cardio ROS   Rhythm:Regular Rate:Normal     Neuro/Psych  Headaches,    GI/Hepatic Neg liver ROS, GI history noted. CG   Endo/Other  negative endocrine ROS  Renal/GU negative Renal ROS     Musculoskeletal   Abdominal   Peds  Hematology   Anesthesia Other Findings   Reproductive/Obstetrics                            Anesthesia Physical Anesthesia Plan  ASA: III  Anesthesia Plan: General   Post-op Pain Management:    Induction: Intravenous  Airway Management Planned: Oral ETT  Additional Equipment:   Intra-op Plan:   Post-operative Plan: Extubation in OR  Informed Consent: I have reviewed the patients History and Physical, chart, labs and discussed the procedure including the risks, benefits and alternatives for the proposed anesthesia with the patient or authorized representative who has indicated his/her understanding and acceptance.   Dental advisory given  Plan Discussed with: CRNA and Anesthesiologist  Anesthesia Plan Comments:         Anesthesia Quick Evaluation

## 2016-04-29 NOTE — H&P (Signed)
History of Present Illness  The patient is a 30 year old male who presents with an inguinal hernia. The patient is a [age] year old male who is referred by Dr. Ivin BootyJoshua Dettinger for evaluation of a left inguinal hernia. Patient states that approximately a month ago he noticed a bulge and firmness to the left inguinal area. The patient had a previous right inguinal hernia repaired in open fashion several years ago. 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  Patient has not had any signs of incarceration or strangulation.   Other Problems  Inguinal Hernia  Past Surgical History  Laparoscopic Inguinal Hernia Surgery Right. Oral Surgery  Diagnostic Studies History  Colonoscopy never  Allergies  Amoxicillin *PENICILLINS*  Medication History  Adderall XR (30MG  Capsule ER 24HR, Oral) Active. Medications Reconciled  Social History Caffeine use Carbonated beverages. No alcohol use No drug use Tobacco use Former smoker.  Family History Migraine Headache Mother.    Review of Systems  General Not Present- Appetite Loss, Chills, Fatigue, Fever, Night Sweats, Weight Gain and Weight Loss. Skin Not Present- Change in Wart/Mole, Dryness, Hives, Jaundice, New Lesions, Non-Healing Wounds, Rash and Ulcer. HEENT Not Present- Earache, Hearing Loss, Hoarseness, Nose Bleed, Oral Ulcers, Ringing in the Ears, Seasonal Allergies, Sinus Pain, Sore Throat, Visual Disturbances, Wears glasses/contact lenses and Yellow Eyes. Respiratory Not Present- Bloody sputum, Chronic Cough, Difficulty Breathing, Snoring and Wheezing. Breast Not Present- Breast Mass, Breast Pain, Nipple Discharge and Skin Changes. Cardiovascular Not Present- Chest Pain, Difficulty Breathing Lying Down, Leg Cramps, Palpitations, Rapid Heart Rate, Shortness of Breath and Swelling of Extremities. Gastrointestinal Present- Abdominal Pain. Not Present- Bloating, Bloody Stool, Change in Bowel Habits,  Chronic diarrhea, Constipation, Difficulty Swallowing, Excessive gas, Gets full quickly at meals, Hemorrhoids, Indigestion, Nausea, Rectal Pain and Vomiting. Male Genitourinary Not Present- Blood in Urine, Change in Urinary Stream, Frequency, Impotence, Nocturia, Painful Urination, Urgency and Urine Leakage. Musculoskeletal Not Present- Back Pain, Joint Pain, Joint Stiffness, Muscle Pain, Muscle Weakness and Swelling of Extremities. Neurological Not Present- Decreased Memory, Fainting, Headaches, Numbness, Seizures, Tingling, Tremor, Trouble walking and Weakness. Psychiatric Not Present- Anxiety, Bipolar, Change in Sleep Pattern, Depression, Fearful and Frequent crying. Endocrine Not Present- Cold Intolerance, Excessive Hunger, Hair Changes, Heat Intolerance, Hot flashes and New Diabetes. Hematology Not Present- Blood Thinners, Easy Bruising, Excessive bleeding, Gland problems, HIV and Persistent Infections.   BP (!) 133/94   Pulse 70   Temp 98.3 F (36.8 C) (Oral)   Resp 18   SpO2 100%    Physical Exam  General Mental Status-Alert. General Appearance-Consistent with stated age. Hydration-Well hydrated. Voice-Normal.  Head and Neck Head-normocephalic, atraumatic with no lesions or palpable masses. Trachea-midline.  Eye Eyeball - Bilateral-Extraocular movements intact. Sclera/Conjunctiva - Bilateral-No scleral icterus.  Chest and Lung Exam Chest and lung exam reveals -quiet, even and easy respiratory effort with no use of accessory muscles. Inspection Chest Wall - Normal. Back - normal.  Cardiovascular Cardiovascular examination reveals -normal heart sounds, regular rate and rhythm with no murmurs.  Abdomen Inspection Skin - Scar - no surgical scars. Hernias - Inguinal hernia - Left - Reducible. Palpation/Percussion Normal exam - Soft, Non Tender, No Rebound tenderness, No Rigidity (guarding) and No hepatosplenomegaly. Auscultation Normal exam  - Bowel sounds normal.  Neurologic Neurologic evaluation reveals -alert and oriented x 3 with no impairment of recent or remote memory. Mental Status-Normal.  Musculoskeletal Normal Exam - Left-Upper Extremity Strength Normal and Lower Extremity Strength Normal. Normal Exam - Right-Upper Extremity  Strength Normal, Lower Extremity Weakness.    Assessment & Plan  LEFT INGUINAL HERNIA (K40.90) Impression: 30 year old male with a left inguinal hernia  1. The patient will like to proceed to the operating room for laparoscopic left inguinal hernia repair with mesh.  2. I discussed with the patient the signs and symptoms of incarceration and strangulation and the need to proceed to the ER should they occur.  3. I discussed with the patient the risks and benefits of the procedure to include but not limited to: Infection, bleeding, damage to surrounding structures, possible need for further surgery, possible nerve pain, and possible recurrence. The patient was understanding and wishes to proceed.

## 2016-04-29 NOTE — Transfer of Care (Signed)
Immediate Anesthesia Transfer of Care Note  Patient: Kurt Bishop  Procedure(s) Performed: Procedure(s): LAPAROSCOPIC LEFT INGUINAL HERNIA REPAIR WITH MESH (Left) INSERTION OF MESH (Left)  Patient Location: PACU  Anesthesia Type:General  Level of Consciousness: awake, alert  and oriented  Airway & Oxygen Therapy: Patient Spontanous Breathing and Patient connected to nasal cannula oxygen  Post-op Assessment: Report given to RN, Post -op Vital signs reviewed and stable and Patient moving all extremities X 4  Post vital signs: Reviewed and stable  Last Vitals:  Vitals:   04/29/16 0723 04/29/16 1018  BP: (!) 133/94   Pulse: 70   Resp: 18   Temp: 36.8 C (P) 36.5 C    Last Pain:  Vitals:   04/29/16 0723  TempSrc: Oral         Complications: No apparent anesthesia complications

## 2016-04-30 ENCOUNTER — Encounter (HOSPITAL_COMMUNITY): Payer: Self-pay | Admitting: General Surgery

## 2016-05-16 ENCOUNTER — Telehealth: Payer: Self-pay | Admitting: Family Medicine

## 2016-05-16 MED ORDER — AMPHETAMINE-DEXTROAMPHET ER 30 MG PO CP24: 30.0000 mg | ORAL_CAPSULE | ORAL | 0 refills | Status: DC

## 2016-05-16 MED ORDER — AMPHETAMINE-DEXTROAMPHET ER 30 MG PO CP24
30.0000 mg | ORAL_CAPSULE | ORAL | 0 refills | Status: DC
Start: 2016-07-15 — End: 2016-07-14

## 2016-05-16 NOTE — Telephone Encounter (Signed)
RX printed x 3 months, left up front and patient aware to pick up  

## 2016-05-16 NOTE — Telephone Encounter (Signed)
Ok to refill 

## 2016-05-16 NOTE — Telephone Encounter (Signed)
ok 

## 2016-05-16 NOTE — Telephone Encounter (Signed)
Patient called requesting a refill on his adderall 30 mg  CB# [phone]

## 2016-06-19 ENCOUNTER — Ambulatory Visit (INDEPENDENT_AMBULATORY_CARE_PROVIDER_SITE_OTHER): Payer: Worker's Compensation

## 2016-06-19 ENCOUNTER — Ambulatory Visit (INDEPENDENT_AMBULATORY_CARE_PROVIDER_SITE_OTHER): Payer: Worker's Compensation | Admitting: Family Medicine

## 2016-06-19 ENCOUNTER — Encounter: Payer: Self-pay | Admitting: Family Medicine

## 2016-06-19 VITALS — BP 121/74 | HR 85 | Temp 97.8°F | Ht 70.0 in | Wt 133.0 lb

## 2016-06-19 DIAGNOSIS — M79642 Pain in left hand: Secondary | ICD-10-CM | POA: Diagnosis not present

## 2016-06-19 DIAGNOSIS — S62661A Nondisplaced fracture of distal phalanx of left index finger, initial encounter for closed fracture: Secondary | ICD-10-CM | POA: Diagnosis not present

## 2016-06-19 NOTE — Progress Notes (Signed)
BP 121/74 (BP Location: Left Arm, Patient Position: Sitting, Cuff Size: Normal)   Pulse 85   Temp 97.8 F (36.6 C) (Oral)   Ht 5\' 10"  (1.778 m)   Wt 133 lb (60.3 kg)   BMI 19.08 kg/m    Subjective:    Patient ID: Kurt GiovanniChristopher F Toothaker, male    DOB: May 23, 1985, 31 y.o.   MRN: [id number]  HPI: Kurt Bishop is a 31 y.o. male presenting on 06/19/2016 for Hand Pain (Left, hand caught in roller)   HPI Left hand pain/Worker's Comp. Patient has been having pain in his left hand since an injury at work earlier today. He says his glove got caught inside a roller and then pulled through the machine which smashed the hands of his index middle and ring fingers. The worst of it is in his index and middle finger. He had some bleeding initially from around the nail beds and has bruising throughout both and has swelling. He has been trying to ice it which is helped the pain and he currently rates his pain is 3 out of 10 as long as he is not moving her using it. He works for [employer]. The date of injury is 06/19/2016.  Relevant past medical, surgical, family and social history reviewed and updated as indicated. Interim medical history since our last visit reviewed. Allergies and medications reviewed and updated.  Review of Systems  Constitutional: Negative for chills and fever.  Respiratory: Negative for shortness of breath and wheezing.   Cardiovascular: Negative for chest pain and leg swelling.  Musculoskeletal: Positive for arthralgias and joint swelling.  Skin: Positive for color change (bruising). Negative for rash.  All other systems reviewed and are negative.   Per HPI unless specifically indicated above      Objective:    BP 121/74 (BP Location: Left Arm, Patient Position: Sitting, Cuff Size: Normal)   Pulse 85   Temp 97.8 F (36.6 C) (Oral)   Ht 5\' 10"  (1.778 m)   Wt 133 lb (60.3 kg)   BMI 19.08 kg/m   Wt Readings from Last 3 Encounters:  06/19/16 133 lb (60.3 kg)    04/23/16 133 lb 2.5 oz (60.4 kg)  03/03/16 132 lb 6 oz (60 kg)    Physical Exam  Constitutional: He appears well-developed and well-nourished. No distress.  Musculoskeletal: Normal range of motion. He exhibits no edema.       Left hand: He exhibits tenderness and bony tenderness. He exhibits normal range of motion. Decreased sensation (Decreased sensation in the tips of his fingers of index and middle finger.) noted. Normal strength noted.  Patient has bleeding and hemorrhage under both index and middle finger nails and it appears that the middle finger nail is about to come off. He has pain on the distal tip of his index finger. Some decreased sensation because of all the bruising and swelling.  Neurological: He is alert. Coordination normal.  Skin: Skin is warm and dry. No rash noted. He is not diaphoretic.  Psychiatric: He has a normal mood and affect. His behavior is normal.  Nursing note and vitals reviewed.  X-ray, left hand: X-ray shows a hairline fracture that is nondisplaced in the distal phalanx of the index finger. No other signs of acute bony abnormalities. Await final read by radiology    Assessment & Plan:   Problem List Items Addressed This Visit    None    Visit Diagnoses    Left hand pain    -  Primary   Relevant Orders   DG Hand Complete Left   Closed nondisplaced fracture of distal phalanx of left index finger, initial encounter          Keep out of dirty areas or keep covered so that it prevents soiling infection. Keep left hand away from machinery for the next 4 weeks.  Follow up plan: Return in about 4 weeks (around 07/17/2016), or if symptoms worsen or fail to improve, for Repeat x-ray in 4 weeks to monitor healing.  Counseling provided for all of the vaccine components Orders Placed This Encounter  Procedures  . DG Hand Complete Left    Arville Care, MD [facility] 06/19/2016, 4:05 PM

## 2016-07-14 ENCOUNTER — Ambulatory Visit (INDEPENDENT_AMBULATORY_CARE_PROVIDER_SITE_OTHER): Payer: BLUE CROSS/BLUE SHIELD | Admitting: Family Medicine

## 2016-07-14 VITALS — BP 114/80 | HR 80 | Temp 97.9°F | Wt 134.0 lb

## 2016-07-14 DIAGNOSIS — F902 Attention-deficit hyperactivity disorder, combined type: Secondary | ICD-10-CM

## 2016-07-14 MED ORDER — AMPHETAMINE-DEXTROAMPHET ER 30 MG PO CP24: 30.0000 mg | ORAL_CAPSULE | ORAL | 0 refills | Status: DC

## 2016-07-14 NOTE — Progress Notes (Signed)
Subjective:    Patient ID: Kurt Bishop, male    DOB: 1986/05/14, 31 y.o.   MRN: [id number]  Medication Refill     Patient is here today for refill his Adderall. He takes 30 mg by mouth every morning of Adderall XR. He denies any insomnia. He denies any weight loss. He is doing well at his job. The patient no longer experiences withdrawal symptoms on the medication. The medicine lasts throughout his entire work day. He does not crash when he does not take the medication on weekends. However the present time he feels that he still benefits from medication is necessary for him to function at his job. There is no evidence of abuse or diversion Past Medical History:  Diagnosis Date  . ADD (attention deficit disorder)   . Complication of anesthesia    woke up slow & also reports that he passed out 3 times after getting home post hernia repair    . Family history of adverse reaction to anesthesia    passing out & nausea & vomiting - Mother   . Headache    uses excedrin 4-5 times per yr.   . Seizure-like activity (HCC)    associated with the vasovagal response, seeing blood, smashing thumb at work with a hammer    . Syncope, vasovagal 09/2014   "passed out post op & again at work & taken to [facility] in [place]"   No current outpatient prescriptions on file prior to visit.   No current facility-administered medications on file prior to visit.    Allergies  Allergen Reactions  . Percocet [Oxycodone-Acetaminophen] Nausea And Vomiting  . Amoxicillin Rash and Other (See Comments)    Caused rash with sun exposure per patient  Has patient had a PCN reaction causing immediate rash, facial/tongue/throat swelling, SOB or lightheadedness with hypotension:No Has patient had a PCN reaction causing severe rash involving mucus membranes or skin necrosis:No Has patient had a PCN reaction that required hospitalization:No Has patient had a PCN reaction occurring within the last 10 years:No If all  of the above answers are "NO", then may proceed with Cephalosporin use.    Social History   Social History  . Marital status: Married    Spouse name: N/A  . Number of children: N/A  . Years of education: N/A   Occupational History  . Not on file.   Social History Main Topics  . Smoking status: Former Smoker    Types: E-cigarettes    Quit date: 06/19/2012  . Smokeless tobacco: Former Neurosurgeon    Quit date: 06/18/2012  . Alcohol use No  . Drug use: No  . Sexual activity: Not on file   Other Topics Concern  . Not on file   Social History Narrative  . No narrative on file     Review of Systems  All other systems reviewed and are negative.      Objective:   Physical Exam  Constitutional: He appears well-developed and well-nourished.  HENT:  Mouth/Throat: Abnormal dentition. Dental caries present.  Neck: Neck supple. No JVD present.  Cardiovascular: Normal rate, regular rhythm and normal heart sounds.   No murmur heard. Pulmonary/Chest: Effort normal and breath sounds normal. No respiratory distress. He has no wheezes. He has no rales.  Abdominal: Soft. Bowel sounds are normal. He exhibits no distension. There is no tenderness. There is no rebound and no guarding.  Musculoskeletal: He exhibits no edema.  Lymphadenopathy:    He has no cervical adenopathy.  Vitals reviewed.         Assessment & Plan:  Attention deficit hyperactivity disorder (ADHD), combined type  Continue Adderall XR 30 mg by mouth every morning. I gave the patient a prescription for  3 months. Recheck in 6 months

## 2016-07-17 ENCOUNTER — Encounter: Payer: Self-pay | Admitting: Family Medicine

## 2016-07-17 ENCOUNTER — Ambulatory Visit (INDEPENDENT_AMBULATORY_CARE_PROVIDER_SITE_OTHER): Payer: Worker's Compensation | Admitting: Family Medicine

## 2016-07-17 VITALS — BP 121/77 | HR 105 | Temp 97.3°F | Ht 70.0 in | Wt 136.6 lb

## 2016-07-17 DIAGNOSIS — S62661D Nondisplaced fracture of distal phalanx of left index finger, subsequent encounter for fracture with routine healing: Secondary | ICD-10-CM | POA: Diagnosis not present

## 2016-07-17 NOTE — Progress Notes (Signed)
BP 121/77   Pulse (!) 105   Temp 97.3 Bishop (36.3 C) (Oral)   Ht 5\' 10"  (1.778 m)   Wt 136 lb 9 oz (61.9 kg)   BMI 19.59 kg/m    Subjective:    Patient ID: Kurt Bishop Hilyer, male    DOB: 06/04/1985, 31 y.o.   MRN: [id number]  HPI: Kurt Bishop Amison is a 31 y.o. male presenting on 07/17/2016 for W/C followup left hand pain   HPI Left hand pain/Worker's Comp. Patient has been having pain in his left hand since an injury at work. He says his glove got caught inside a roller and then pulled through the machine which smashed the hands of his index middle and ring fingers.  He works for [employer]. The date of injury is 06/19/2016.He is coming in today for a 4 week follow-up. He does admit that he has not been wearing the splint except for the first week and then he took it off because it would not fit inside of his work gloves. He says the pain is mostly gone and his nailbeds did not come off and he still has bruising under the nails but the swelling has gone down significantly. There is no redness or warmth. He has full range of motion in all 3 of the fingers on his left hand.  Relevant past medical, surgical, family and social history reviewed and updated as indicated. Interim medical history since our last visit reviewed. Allergies and medications reviewed and updated.  Review of Systems  Constitutional: Negative for chills and fever.  Respiratory: Negative for shortness of breath and wheezing.   Cardiovascular: Negative for chest pain and leg swelling.  Musculoskeletal: Positive for arthralgias and joint swelling.  Skin: Positive for color change (bruising). Negative for rash.  All other systems reviewed and are negative.   Per HPI unless specifically indicated above      Objective:    BP 121/77   Pulse (!) 105   Temp 97.3 Bishop (36.3 C) (Oral)   Ht 5\' 10"  (1.778 m)   Wt 136 lb 9 oz (61.9 kg)   BMI 19.59 kg/m   Wt Readings from Last 3 Encounters:  07/17/16 136 lb 9 oz  (61.9 kg)  07/14/16 134 lb (60.8 kg)  06/19/16 133 lb (60.3 kg)    Physical Exam  Constitutional: He appears well-developed and well-nourished. No distress.  Musculoskeletal: Normal range of motion. He exhibits no edema.       Left hand: He exhibits tenderness and bony tenderness. He exhibits normal range of motion. Decreased sensation (Decreased sensation in the tips of his fingers of index and middle finger.) noted. Normal strength noted.  Patient has bruising under both index and middle finger nails that have started to grow out with the nail some.  He has minimal pain on the distal tip of his index finger. No decreased sensation or loss of capillary refill or loss of strength or range of motion..  Neurological: He is alert. Coordination normal.  Skin: Skin is warm and dry. No rash noted. He is not diaphoretic.  Psychiatric: He has a normal mood and affect. His behavior is normal.  Nursing note and vitals reviewed.     Assessment & Plan:   Problem List Items Addressed This Visit    None    Visit Diagnoses    Closed nondisplaced fracture of distal phalanx of left index finger with routine healing, subsequent encounter    -  Primary   Appears  to be healing well and range of motion is intact, pain is minimal      Cleared for work to go back full-time and without limitations  Follow up plan: Return if symptoms worsen or fail to improve.  Counseling provided for all of the vaccine components No orders of the defined types were placed in this encounter.   Arville Care, MD [facility] 07/17/2016, 8:09 AM

## 2017-02-13 ENCOUNTER — Ambulatory Visit (INDEPENDENT_AMBULATORY_CARE_PROVIDER_SITE_OTHER): Payer: Self-pay | Admitting: Family Medicine

## 2017-02-13 ENCOUNTER — Encounter: Payer: Self-pay | Admitting: Family Medicine

## 2017-02-13 VITALS — BP 100/64 | HR 76 | Temp 98.2°F | Resp 16 | Ht 70.5 in | Wt 140.0 lb

## 2017-02-13 DIAGNOSIS — G43701 Chronic migraine without aura, not intractable, with status migrainosus: Secondary | ICD-10-CM

## 2017-02-13 DIAGNOSIS — F902 Attention-deficit hyperactivity disorder, combined type: Secondary | ICD-10-CM

## 2017-02-13 MED ORDER — AMPHETAMINE-DEXTROAMPHET ER 30 MG PO CP24: 30.0000 mg | ORAL_CAPSULE | ORAL | 0 refills | Status: DC

## 2017-02-13 MED ORDER — PREDNISONE 20 MG PO TABS: ORAL_TABLET | ORAL | 0 refills | Status: DC

## 2017-02-13 MED ORDER — SUMATRIPTAN SUCCINATE 50 MG PO TABS: 50.0000 mg | ORAL_TABLET | ORAL | 0 refills | Status: DC | PRN

## 2017-02-13 MED ORDER — PROMETHAZINE HCL 25 MG PO TABS: 25.0000 mg | ORAL_TABLET | Freq: Three times a day (TID) | ORAL | 0 refills | Status: DC | PRN

## 2017-02-13 NOTE — Progress Notes (Signed)
Subjective:    Patient ID: Kurt Bishop, male    DOB: 01/18/1986, 31 y.o.   MRN: [id number]  Medication Refill   Migraine     06/2016 Patient is here today for refill his Adderall. He takes 30 mg by mouth every morning of Adderall XR. He denies any insomnia. He denies any weight loss. He is doing well at his job. The patient no longer experiences withdrawal symptoms on the medication. The medicine lasts throughout his entire work day. He does not crash when he does not take the medication on weekends. However the present time he feels that he still benefits from medication is necessary for him to function at his job. There is no evidence of abuse or diversion.  At that time, my plan was: Continue Adderall XR 30 mg by mouth every morning. I gave the patient a prescription for  3 months. Recheck in 6 months  02/13/17 Patient was recently seen in the emergency room for migraine. He states that over the last 6 months, he is starting to develop migraine headaches. His mother has a history of severe migraines. He is starting to develop unilateral severe pressure-like headaches with photophobia and phonophobia as well as nausea. He states that he's had 5 or 6 over the last 6 months. The most recent one started on Sunday. He was able to abort it with Excedrin Migraine however throughout the week he continued to have a dull headache. Then on Wednesday the headache suddenly intensified. He described it as a vice on both sides of his head gradually getting worse to the point that he can no longer stand it. He had severe photophobia. He had severe phonophobia. He had nausea and vomiting. He denies any vertigo. He denied any head injury. He denied any fevers or chills or neck stiffness or rash. 911 was called and he went to the emergency room where CAT scan was negative. He was given a cocktail in the emergency room of Phenergan, some type of pain medication, and Reglan. The headache subsided however is  still not completely gone. He has a dull headache even today with some photophobia. He is in the exam room wearing sunglasses. He also requests a refill on his Adderall. He is currently taking 30 mg of Adderall daily for ADD. The medication works well for him. He denies any side effects from medication. He would like his refill why he is here Past Medical History:  Diagnosis Date  . ADD (attention deficit disorder)   . Complication of anesthesia    woke up slow & also reports that he passed out 3 times after getting home post hernia repair    . Family history of adverse reaction to anesthesia    passing out & nausea & vomiting - Mother   . Headache    uses excedrin 4-5 times per yr.   . Seizure-like activity (HCC)    associated with the vasovagal response, seeing blood, smashing thumb at work with a hammer    . Syncope, vasovagal 09/2014   "passed out post op & again at work & taken to [facility] in [place]"   No current outpatient prescriptions on file prior to visit.   No current facility-administered medications on file prior to visit.    Allergies  Allergen Reactions  . Percocet [Oxycodone-Acetaminophen] Nausea And Vomiting  . Amoxicillin Rash and Other (See Comments)    Caused rash with sun exposure per patient  Has patient had a PCN reaction causing immediate  rash, facial/tongue/throat swelling, SOB or lightheadedness with hypotension:No Has patient had a PCN reaction causing severe rash involving mucus membranes or skin necrosis:No Has patient had a PCN reaction that required hospitalization:No Has patient had a PCN reaction occurring within the last 10 years:No If all of the above answers are "NO", then may proceed with Cephalosporin use.    Social History   Social History  . Marital status: Married    Spouse name: N/A  . Number of children: N/A  . Years of education: N/A   Occupational History  . Not on file.   Social History Main Topics  . Smoking status: Former  Smoker    Types: E-cigarettes    Quit date: 06/19/2012  . Smokeless tobacco: Former Neurosurgeon    Quit date: 06/18/2012  . Alcohol use No  . Drug use: No  . Sexual activity: Not on file   Other Topics Concern  . Not on file   Social History Narrative  . No narrative on file     Review of Systems  All other systems reviewed and are negative.      Objective:   Physical Exam  Constitutional: He is oriented to person, place, and time. He appears well-developed and well-nourished.  HENT:  Mouth/Throat: Abnormal dentition. Dental caries present.  Eyes: Pupils are equal, round, and reactive to light. Conjunctivae and EOM are normal.  Neck: Neck supple. No JVD present.  Cardiovascular: Normal rate, regular rhythm and normal heart sounds.   No murmur heard. Pulmonary/Chest: Effort normal and breath sounds normal. No respiratory distress. He has no wheezes. He has no rales.  Abdominal: Soft. Bowel sounds are normal. He exhibits no distension. There is no tenderness. There is no rebound and no guarding.  Musculoskeletal: He exhibits no edema.  Lymphadenopathy:    He has no cervical adenopathy.  Neurological: He is alert and oriented to person, place, and time. He has normal reflexes. He displays normal reflexes. No cranial nerve deficit. He exhibits normal muscle tone. Coordination normal.  Vitals reviewed.         Assessment & Plan:  Chronic migraine without aura with status migrainosus, not intractable - Plan: predniSONE (DELTASONE) 20 MG tablet, SUMAtriptan (IMITREX) 50 MG tablet, promethazine (PHENERGAN) 25 MG tablet  I believe the patient is in status migrainosus although not severe. I would use a prednisone taper pack to abort the headache. In the future, I gave the patient prescription for Imitrex as well as Phenergan. He can take 50 mg of Imitrex at the first sign of a migraine and then repeat it 1 in 2 hours if the headache persists. He can use Phenergan 25 mg every 6 hours as  needed for nausea or headache. If the frequency of the headaches intensifies, I will start the patient on Topamax. We also discussed triggers for migraine. I did refill the patient's Adderall while he was here today and gave him 3 month worth of refills

## 2017-02-27 MED FILL — ADDERALL XR 30 MG CAP SA: 30 | 30 days supply | Qty: 30 | Fill #0

## 2017-03-06 ENCOUNTER — Telehealth: Payer: Self-pay | Admitting: Family Medicine

## 2017-03-06 NOTE — Telephone Encounter (Signed)
I am not sure, do not recollect.  Does he mean switch to vyvanse

## 2017-03-06 NOTE — Telephone Encounter (Signed)
Patient is calling to say that the med he is taking now for his adhd is making him feel funny, he said that he and dr pickard had discussed another med that might do better for him please call [phone] and discuss

## 2017-03-09 NOTE — Telephone Encounter (Signed)
LMTRC

## 2017-06-29 ENCOUNTER — Encounter: Payer: Self-pay | Admitting: Family Medicine

## 2017-07-15 ENCOUNTER — Other Ambulatory Visit: Payer: Self-pay | Admitting: Family Medicine

## 2017-07-15 DIAGNOSIS — G43701 Chronic migraine without aura, not intractable, with status migrainosus: Secondary | ICD-10-CM

## 2017-07-15 MED ORDER — SUMATRIPTAN SUCCINATE 50 MG PO TABS: 50.0000 mg | ORAL_TABLET | ORAL | 2 refills | Status: DC | PRN

## 2017-08-10 ENCOUNTER — Encounter: Payer: Self-pay | Admitting: Family Medicine

## 2017-09-08 ENCOUNTER — Encounter (HOSPITAL_COMMUNITY): Payer: Self-pay | Admitting: Emergency Medicine

## 2017-09-08 ENCOUNTER — Emergency Department (HOSPITAL_COMMUNITY)
Admission: EM | Admit: 2017-09-08 | Discharge: 2017-09-08 | Disposition: A | Discharge status: Home / Self Care | Payer: Worker's Compensation | Attending: Emergency Medicine | Admitting: Emergency Medicine

## 2017-09-08 ENCOUNTER — Other Ambulatory Visit: Payer: Self-pay

## 2017-09-08 ENCOUNTER — Emergency Department (HOSPITAL_COMMUNITY): Payer: Worker's Compensation

## 2017-09-08 DIAGNOSIS — Z79899 Other long term (current) drug therapy: Secondary | ICD-10-CM | POA: Insufficient documentation

## 2017-09-08 DIAGNOSIS — Y9289 Other specified places as the place of occurrence of the external cause: Secondary | ICD-10-CM | POA: Insufficient documentation

## 2017-09-08 DIAGNOSIS — S0181XA Laceration without foreign body of other part of head, initial encounter: Secondary | ICD-10-CM

## 2017-09-08 DIAGNOSIS — S0990XA Unspecified injury of head, initial encounter: Secondary | ICD-10-CM | POA: Diagnosis not present

## 2017-09-08 DIAGNOSIS — Y990 Civilian activity done for income or pay: Secondary | ICD-10-CM | POA: Insufficient documentation

## 2017-09-08 DIAGNOSIS — W208XXA Other cause of strike by thrown, projected or falling object, initial encounter: Secondary | ICD-10-CM | POA: Diagnosis not present

## 2017-09-08 DIAGNOSIS — Y939 Activity, unspecified: Secondary | ICD-10-CM | POA: Diagnosis not present

## 2017-09-08 DIAGNOSIS — S01112A Laceration without foreign body of left eyelid and periocular area, initial encounter: Secondary | ICD-10-CM | POA: Insufficient documentation

## 2017-09-08 DIAGNOSIS — S00412A Abrasion of left ear, initial encounter: Secondary | ICD-10-CM

## 2017-09-08 MED ORDER — LIDOCAINE-EPINEPHRINE (PF) 2 %-1:200000 IJ SOLN
10.0000 mL | Freq: Once | INTRAMUSCULAR | Status: AC
  Administered 2017-09-08: 10 mL
  Filled 2017-09-08: qty 20

## 2017-09-08 MED ORDER — BACITRACIN-NEOMYCIN-POLYMYXIN 400-5-5000 EX OINT
TOPICAL_OINTMENT | Freq: Once | CUTANEOUS | Status: AC
  Administered 2017-09-08: 18:00:00 via TOPICAL
  Filled 2017-09-08: qty 1

## 2017-09-08 MED ORDER — LIDOCAINE-EPINEPHRINE-TETRACAINE (LET) SOLUTION
3.0000 mL | Freq: Once | NASAL | Status: AC
  Administered 2017-09-08: 3 mL via TOPICAL
  Filled 2017-09-08: qty 3

## 2017-09-08 NOTE — ED Triage Notes (Signed)
Pt hit in the head by some 2x4 blocks while at work.  Has lac above lt eyebrow.  Denies loc. Reports mild dizziness since the incident,  Denies n/v.  Pt a&o x4

## 2017-09-08 NOTE — Discharge Instructions (Addendum)
Have your sutures removed in 7 days.  Keep your wound clean and dry,  Until a good scab forms - you may then wash gently twice daily with mild soap and water, but dry completely after.  Get rechecked for any sign of infection (redness,  Swelling,  Increased pain or drainage of purulent fluid). ° °

## 2017-09-09 NOTE — ED Provider Notes (Signed)
[facility] EMERGENCY DEPARTMENT Provider Note   CSN: [id number] Arrival date & time: 09/08/17  1418     History   Chief Complaint Chief Complaint  Patient presents with  . Head Laceration    HPI Kurt Bishop is a 32 y.o. male presenting with head injury and eyebrow laceration occurring just prior to arrival. He was at work when some 2x4's fell off a shelf (approx 2 foot in length) striking his head and forehead sustaining injury. He denies loc at the time of the event but reports moderate headache and slight dizziness since the event. Denies n/v/ vision changes, focal weakness and denies neck pain. He has had no treatment prior to arrival except for dressing over the wound.  He is current with his tetanus vaccine.  The history is provided by the patient.    Past Medical History:  Diagnosis Date  . ADD (attention deficit disorder)   . Complication of anesthesia    woke up slow & also reports that he passed out 3 times after getting home post hernia repair    . Family history of adverse reaction to anesthesia    passing out & nausea & vomiting - Mother   . Headache    uses excedrin 4-5 times per yr.   . Seizure-like activity (HCC)    associated with the vasovagal response, seeing blood, smashing thumb at work with a hammer    . Syncope, vasovagal 09/2014   "passed out post op & again at work & taken to [facility] in [place]"    Patient Active Problem List   Diagnosis Date Noted  . ADD (attention deficit disorder)   . 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 HERNIA REPAIR Right   . INGUINAL HERNIA REPAIR  03/24/2012   Procedure: HERNIA REPAIR INGUINAL ADULT;  Surgeon: Fabio Bering, MD;  Location: [facility] ORS;  Service: General;  Laterality: Right;  . INGUINAL HERNIA REPAIR Left 04/29/2016   Procedure: LAPAROSCOPIC LEFT INGUINAL HERNIA REPAIR WITH MESH;  Surgeon: Axel Filler, MD;  Location: [facility] OR;  Service: General;   Laterality: Left;  . INSERTION OF MESH Left 04/29/2016   Procedure: INSERTION OF MESH;  Surgeon: Axel Filler, MD;  Location: [facility] OR;  Service: General;  Laterality: Left;  Marland Kitchen MULTIPLE TOOTH EXTRACTIONS  05/2015   done with  local anesthesia, pt. awake          Home Medications    Prior to Admission medications   Medication Sig Start Date End Date Taking? Authorizing Provider  amphetamine-dextroamphetamine (ADDERALL XR) 30 MG 24 hr capsule Take 1 capsule (30 mg total) by mouth every morning. 02/13/17   Donita Brooks, MD  predniSONE (DELTASONE) 20 MG tablet 3 tabs poqday 1-2, 2 tabs poqday 3-4, 1 tab poqday 5-6 02/13/17   Donita Brooks, MD  promethazine (PHENERGAN) 25 MG tablet Take 1 tablet (25 mg total) by mouth every 8 (eight) hours as needed for nausea or vomiting. 02/13/17   Donita Brooks, MD  SUMAtriptan (IMITREX) 50 MG tablet Take 1 tablet (50 mg total) by mouth every 2 (two) hours as needed for migraine. May repeat in 2 hours if headache persists or recurs. 07/15/17   Donita Brooks, MD    Family History Family History  Problem Relation Age of Onset  . Diabetes Paternal Grandmother   . Hypertension Paternal Grandmother     Social History Social History   Tobacco Use  .  Smoking status: Former Smoker    Types: E-cigarettes    Last attempt to quit: 06/19/2012    Years since quitting: 5.2  . Smokeless tobacco: Former Neurosurgeon    Quit date: 06/18/2012  Substance Use Topics  . Alcohol use: No  . Drug use: No     Allergies   Percocet [oxycodone-acetaminophen] and Amoxicillin   Review of Systems Review of Systems  Constitutional: Negative for fever.  HENT: Negative for congestion and sore throat.   Eyes: Negative.   Respiratory: Negative for chest tightness and shortness of breath.   Cardiovascular: Negative for chest pain.  Gastrointestinal: Negative for abdominal pain and nausea.  Genitourinary: Negative.   Musculoskeletal: Negative for arthralgias, joint  swelling and neck pain.  Skin: Positive for wound. Negative for rash.  Neurological: Positive for dizziness and headaches. Negative for weakness, light-headedness and numbness.  Psychiatric/Behavioral: Negative.      Physical Exam Updated Vital Signs BP 119/73 (BP Location: Right Arm)   Pulse 64   Temp 98.4 F (36.9 C) (Oral)   Resp 17   Ht  (1.803 m)   Wt 61.2 kg (135 lb)   SpO2 98%   BMI 18.83 kg/m   Physical Exam  Constitutional: He appears well-developed and well-nourished.  HENT:  Head: Normocephalic.  Abrasion with small hematoma parietal scalp. Left 4 cm brow laceration, subcutaneous, not through the muscle layer, hemostatic. Small bleeding abrasion left ear.  Eyes: Pupils are equal, round, and reactive to light. Conjunctivae and EOM are normal.  Neck: Normal range of motion.  Cardiovascular: Normal rate, regular rhythm, normal heart sounds and intact distal pulses.  Pulmonary/Chest: Effort normal and breath sounds normal. He has no wheezes.  Abdominal: Soft. Bowel sounds are normal. There is no tenderness.  Musculoskeletal: Normal range of motion.  Neurological: He is alert. He has normal strength. No cranial nerve deficit or sensory deficit. Gait normal. GCS eye subscore is 4. GCS verbal subscore is 5. GCS motor subscore is 6.  Skin: Skin is warm and dry.  Psychiatric: He has a normal mood and affect.  Nursing note and vitals reviewed.    ED Treatments / Results  Labs (all labs ordered are listed, but only abnormal results are displayed) Labs Reviewed - No data to display  EKG None  Radiology Ct Head Wo Contrast  Result Date: 09/08/2017 CLINICAL DATA:  Trauma.  Laceration above left eyebrow. EXAM: CT HEAD WITHOUT CONTRAST TECHNIQUE: Contiguous axial images were obtained from the base of the skull through the vertex without intravenous contrast. COMPARISON:  Oct 05, 2014 FINDINGS: Brain: No evidence of acute infarction, hemorrhage, hydrocephalus,  extra-axial collection or mass lesion/mass effect. Vascular: No hyperdense vessel or unexpected calcification. Skull: Normal. Negative for fracture or focal lesion. Sinuses/Orbits: No acute finding. Other: Laceration over the left supraorbital region. Soft tissues are otherwise normal. IMPRESSION: No acute intracranial abnormality. Electronically Signed   By: Gerome Sam III M.D   On: 09/08/2017 16:45    Procedures Procedures (including critical care time)  LACERATION REPAIR Performed by: Burgess Amor Authorized by: Burgess Amor Consent: Verbal consent obtained. Risks and benefits: risks, benefits and alternatives were discussed Consent given by: patient Patient identity confirmed: provided demographic data Prepped and Draped in normal sterile fashion Wound explored  Laceration Location: left eyebrow  Laceration Length: 4cm  No Foreign Bodies seen or palpated  Anesthesia: local infiltration, after applying LET  Local anesthetic: lidocaine 2% with epinephrine  Anesthetic total: 2 ml  Irrigation method: syringe Amount  of cleaning: standard  Skin closure: ethilon 6-0  Number of sutures: 10  Technique: simple interupted  Patient tolerance: Patient tolerated the procedure well with no immediate complications.  Dressing applied to ear abrasion after cleaning with betadine, abx ointment applied.   Medications Ordered in ED Medications  lidocaine-EPINEPHrine-tetracaine (LET) solution (3 mLs Topical Given 09/08/17 1757)  lidocaine-EPINEPHrine (XYLOCAINE W/EPI) 2 %-1:200000 (PF) injection 10 mL (10 mLs Other Given 09/08/17 1755)  neomycin-bacitracin-polymyxin (NEOSPORIN) ointment ( Topical Given 09/08/17 1755)     Initial Impression / Assessment and Plan / ED Course  I have reviewed the triage vital signs and the nursing notes.  Pertinent labs & imaging results that were available during my care of the patient were reviewed by me and considered in my medical decision making  (see chart for details).     Wound care instructions given.  Pt advised to have sutures removed in 7 days,  Return here sooner for any signs of infection including redness, swelling, worse pain or drainage of pus.  Minor head injury instructions also given.     Final Clinical Impressions(s) / ED Diagnoses   Final diagnoses:  Laceration of forehead, initial encounter  Minor head injury, initial encounter  Abrasion of left ear, initial encounter    ED Discharge Orders    None       Victoriano Lain 09/09/17 5621    Doug Sou, MD 09/09/17 1725

## 2017-09-15 ENCOUNTER — Ambulatory Visit (INDEPENDENT_AMBULATORY_CARE_PROVIDER_SITE_OTHER): Payer: Worker's Compensation | Admitting: Family Medicine

## 2017-09-15 VITALS — BP 121/74 | HR 87 | Temp 99.7°F | Ht 71.0 in | Wt 139.2 lb

## 2017-09-15 DIAGNOSIS — S0181XA Laceration without foreign body of other part of head, initial encounter: Secondary | ICD-10-CM | POA: Diagnosis not present

## 2017-09-15 DIAGNOSIS — Z4802 Encounter for removal of sutures: Secondary | ICD-10-CM | POA: Diagnosis not present

## 2017-09-15 NOTE — Progress Notes (Signed)
Subjective: CC: Work injury Date of injury: 09/08/2017 Employer: [employer] PCP: Susy Frizzle, MD QIH:[id number] Kurt Bishop is a 32 y.o. male presenting to clinic today for:  1. Head injury/ laceration Patient was seen in the emergency department on 09/08/2017 after several 2 x 4's fell off a shelf and hit him in the head.  He did not sustain LOC.  He had a CT head performed at that time which was negative for any acute intracranial processes or fractures.  He had 10 sutures placed along the left upper forehead/brow.  He is here today to have these sutures removed.  He notes that he has been doing well since the accident.  Denies headache, visual disturbance, purulence from lesion, pain.   Allergies  Allergen Reactions  . Percocet [Oxycodone-Acetaminophen] Nausea And Vomiting  . Amoxicillin Rash and Other (See Comments)    Caused rash with sun exposure per patient  Has patient had a PCN reaction causing immediate rash, facial/tongue/throat swelling, SOB or lightheadedness with hypotension:No Has patient had a PCN reaction causing severe rash involving mucus membranes or skin necrosis:No Has patient had a PCN reaction that required hospitalization:No Has patient had a PCN reaction occurring within the last 10 years:No If all of the above answers are "NO", then may proceed with Cephalosporin use.    Past Medical History:  Diagnosis Date  . ADD (attention deficit disorder)   . Complication of anesthesia    woke up slow & also reports that he passed out 3 times after getting home post hernia repair    . Family history of adverse reaction to anesthesia    passing out & nausea & vomiting - Mother   . Headache    uses excedrin 4-5 times per yr.   . Seizure-like activity ([place])    associated with the vasovagal response, seeing blood, smashing thumb at work with a hammer    . Syncope, vasovagal 09/2014   "passed out post op & again at work & taken to [facility] in [place]"   Family History    Problem Relation Age of Onset  . Diabetes Paternal Grandmother   . Hypertension Paternal Grandmother    Social History   Socioeconomic History  . Marital status: Married    Spouse name: Not on file  . Number of children: Not on file  . Years of education: Not on file  . Highest education level: Not on file  Occupational History  . Not on file  Social Needs  . Financial resource strain: Not on file  . Food insecurity:    Worry: Not on file    Inability: Not on file  . Transportation needs:    Medical: Not on file    Non-medical: Not on file  Tobacco Use  . Smoking status: Former Smoker    Types: E-cigarettes    Last attempt to quit: 06/19/2012    Years since quitting: 5.2  . Smokeless tobacco: Former Systems developer    Quit date: 06/18/2012  Substance and Sexual Activity  . Alcohol use: No  . Drug use: No  . Sexual activity: Not on file  Lifestyle  . Physical activity:    Days per week: Not on file    Minutes per session: Not on file  . Stress: Not on file  Relationships  . Social connections:    Talks on phone: Not on file    Gets together: Not on file    Attends religious service: Not on file    Active  member of club or organization: Not on file    Attends meetings of clubs or organizations: Not on file    Relationship status: Not on file  . Intimate partner violence:    Fear of current or ex partner: Not on file    Emotionally abused: Not on file    Physically abused: Not on file    Forced sexual activity: Not on file  Other Topics Concern  . Not on file  Social History Narrative  . Not on file    Health Maintenance: Tetanus UTD   ROS: Per HPI  Objective: Office vital signs reviewed. BP 121/74   Pulse 87   Temp 99.7 Kurt (37.6 C) (Oral)   Ht 5' 11" (1.803 m)   Wt 139 lb 3.2 oz (63.1 kg)   BMI 19.41 kg/m   Physical Examination:  General: Awake, alert, well nourished, No acute distress HEENT: large healing laceration along the left brow. No erythema,  induration, purulence. There is quite a bit of scab formation. 10 sutures noted.  Suture removal: Verbal consent obtained.  Suture removal kit used to remove 10 sutures.  No immediate complications. Patient tolerated procedure well.  Assessment/ Plan: 32 y.o. male   1. Laceration of other part of head without foreign body, initial encounter Laceration appears to be well-healing.  Patient does have a low-grade temperature today in office.  There is no evidence of skin infection on today's exam.  Sutures were removed as above.  Patient tolerated procedure well.  Handout home care instructions were reviewed with patient and a handout was provided.  Follow-up with PCP as needed.  Workers comp forms completed and returned to patient.  2. Visit for suture removal  [place], [place] [phone]

## 2017-09-15 NOTE — Patient Instructions (Signed)
Suture Removal, Care After Refer to this sheet in the next few weeks. These instructions provide you with information on caring for yourself after your procedure. Your health care provider may also give you more specific instructions. Your treatment has been planned according to current medical practices, but problems sometimes occur. Call your health care provider if you have any problems or questions after your procedure. What can I expect after the procedure? After your stitches (sutures) are removed, it is typical to have the following:  Some discomfort and swelling in the wound area.  Slight redness in the area.  Follow these instructions at home:  If you have skin adhesive strips over the wound area, do not take the strips off. They will fall off on their own in a few days. If the strips remain in place after 14 days, you may remove them.  Change any bandages (dressings) at least once a day or as directed by your health care provider. If the bandage sticks, soak it off with warm, soapy water.  Apply cream or ointment only as directed by your health care provider. If using cream or ointment, wash the area with soap and water 2 times a day to remove all the cream or ointment. Rinse off the soap and pat the area dry with a clean towel.  Keep the wound area dry and clean. If the bandage becomes wet or dirty, or if it develops a bad smell, change it as soon as possible.  Continue to protect the wound from injury.  Use sunscreen when out in the sun. New scars become sunburned easily. Contact a health care provider if:  You have increasing redness, swelling, or pain in the wound.  You see pus coming from the wound.  You have a fever.  You notice a bad smell coming from the wound or dressing.  Your wound breaks open (edges not staying together). This information is not intended to replace advice given to you by your health care provider. Make sure you discuss any questions you have  with your health care provider. Document Released: 01/28/2001 Document Revised: 10/11/2015 Document Reviewed: 12/15/2012 Elsevier Interactive Patient Education  2017 [employer]  

## 2018-08-18 IMAGING — CT CT HEAD W/O CM
3 series · 16 of 47 positions shown, 19 images · non-contrast
Comparison: October 05, 2014

CLINICAL DATA: Trauma.  Laceration above left eyebrow.

EXAM:
CT HEAD WITHOUT CONTRAST
TECHNIQUE: Contiguous axial images were obtained from the base of the skull
through the vertex without intravenous contrast.

[Series 2: head trauma wo · axial · 0.40mm/px · z∈[+1304,+1429]mm · 10 of 31 slices shown, 13 images]
[im 3/31  brain]
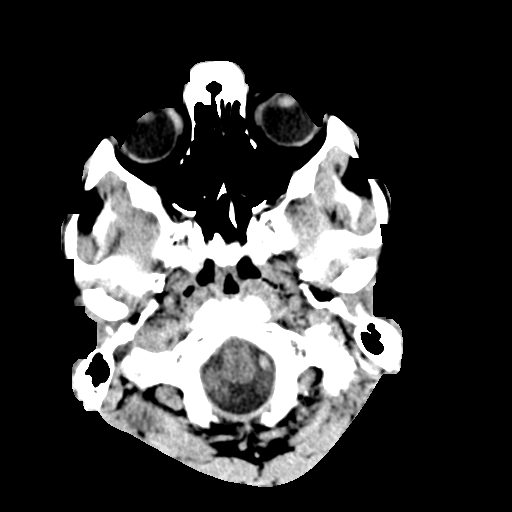
[im 3/31  bone]
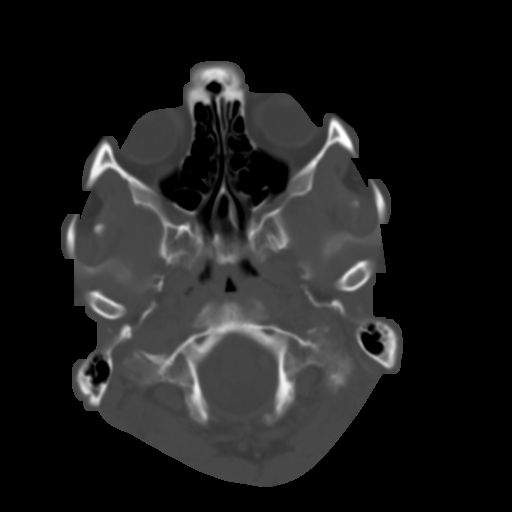
[im 6/31  brain]
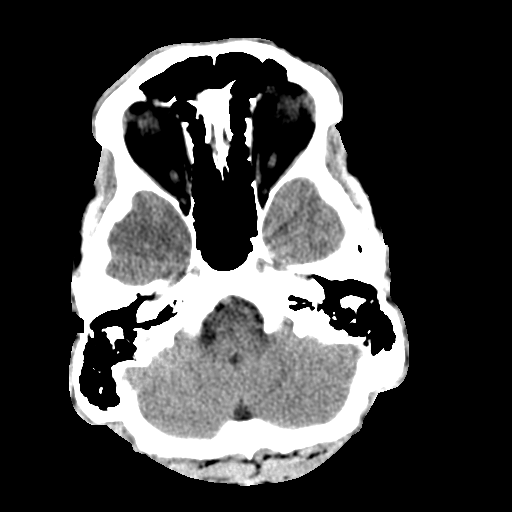
[im 9/31  brain]
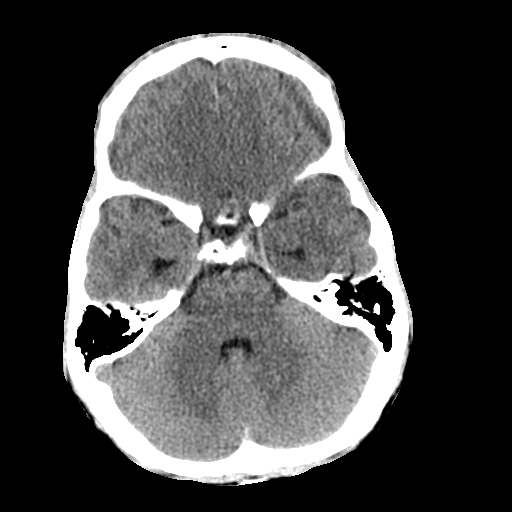
[im 11/31  brain]
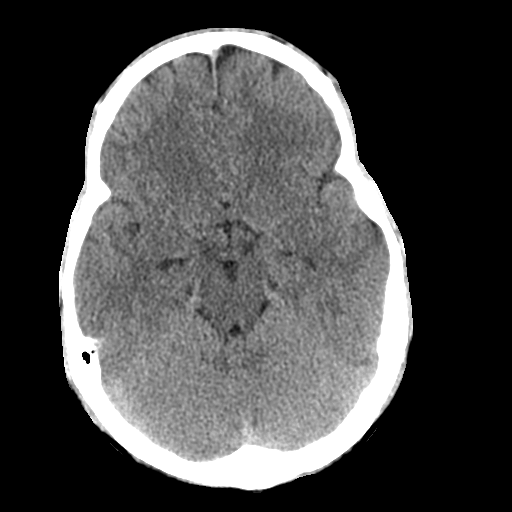
[im 14/31  brain]
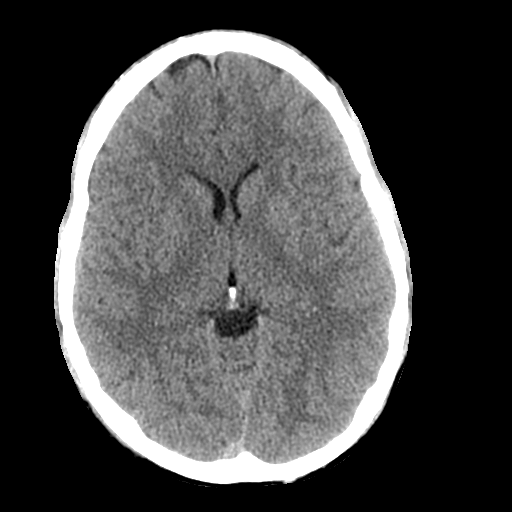
[im 14/31  bone]
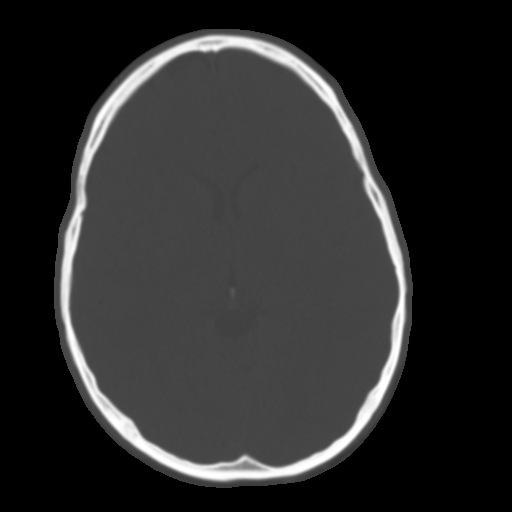
[im 17/31  brain]
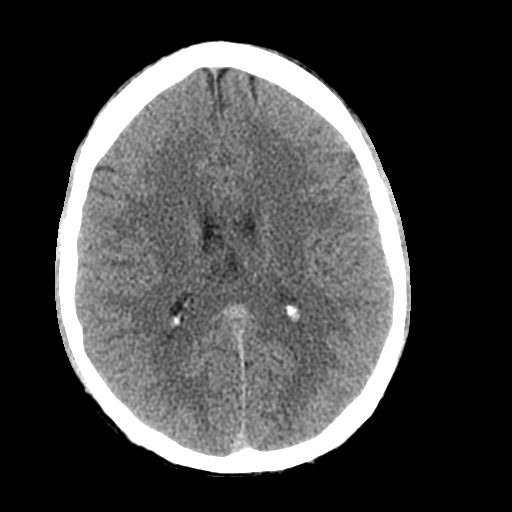
[im 20/31  brain]
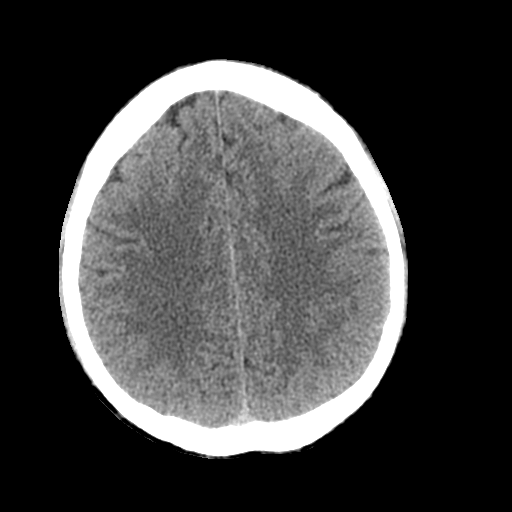
[im 23/31  brain]
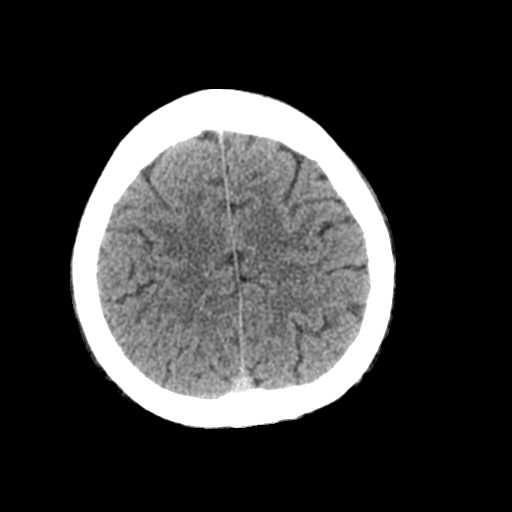
[im 25/31  brain]
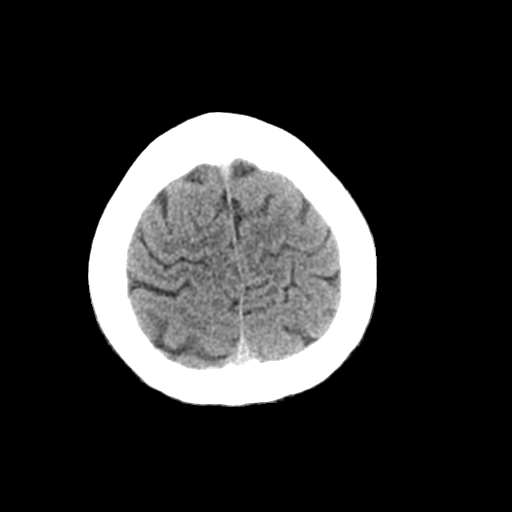
[im 25/31  bone]
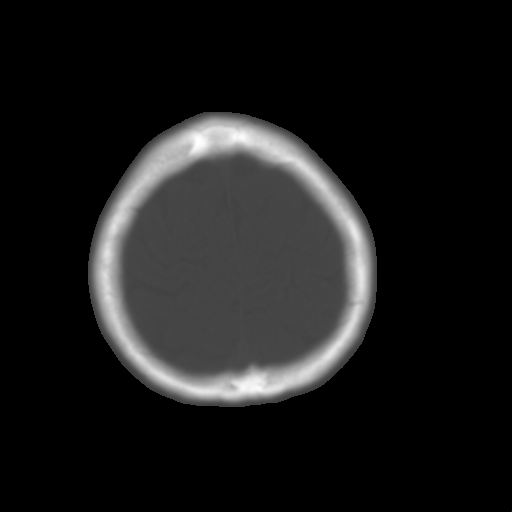
[im 28/31  brain]
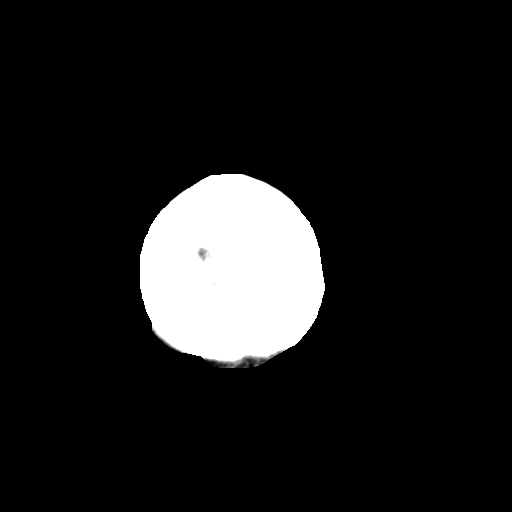

[Series 4: coronal soft tissue · coronal · 0.32mm/px · 3 of 71 slices shown]
[im 24/71  brain]
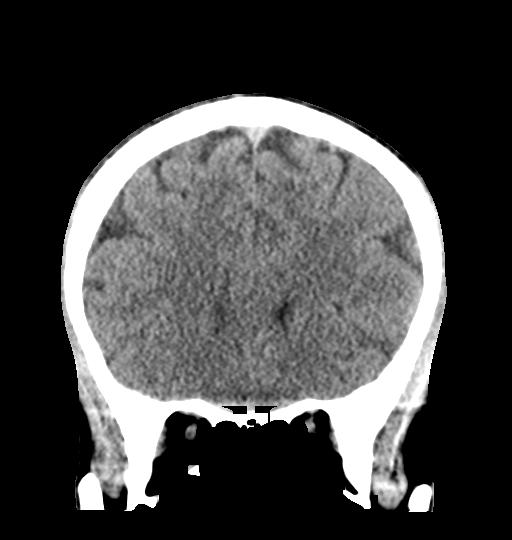
[im 32/71  brain]
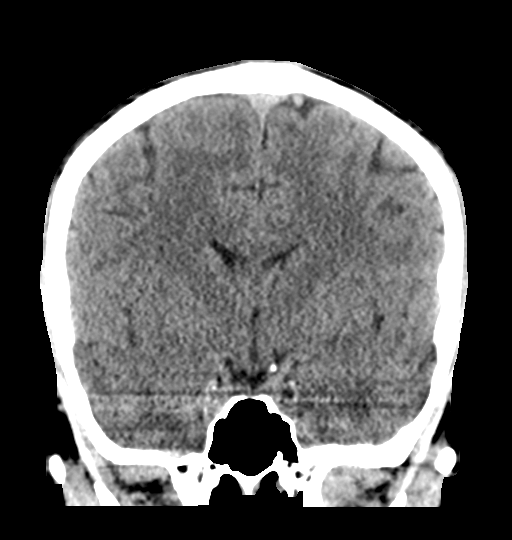
[im 39/71  brain]
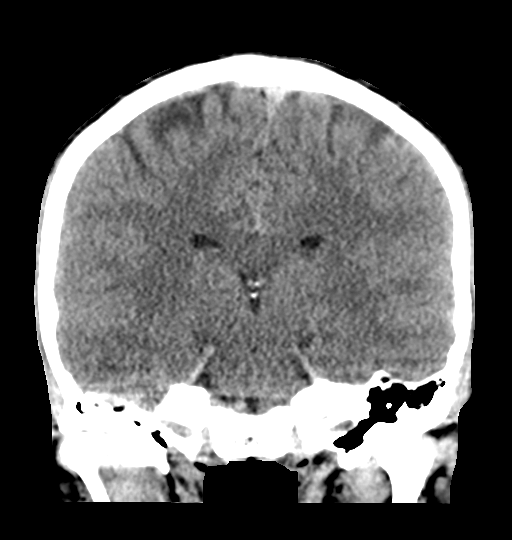

[Series 5: sagittal soft tissue · sagittal · 0.33mm/px · 3 of 55 slices shown]
[im 19/55  brain]
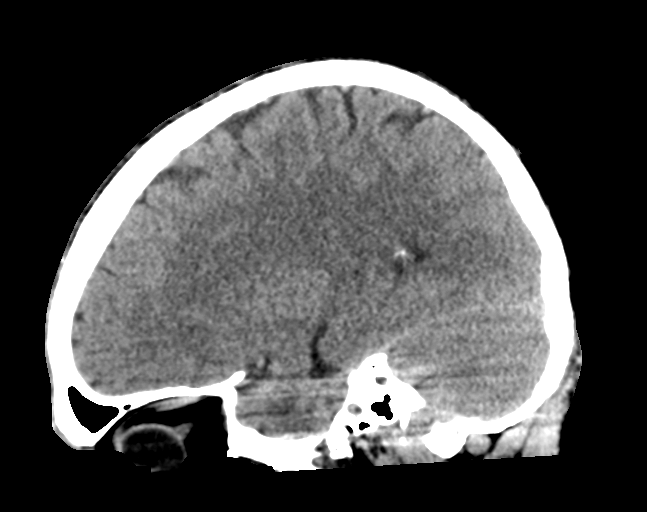
[im 28/55  brain]
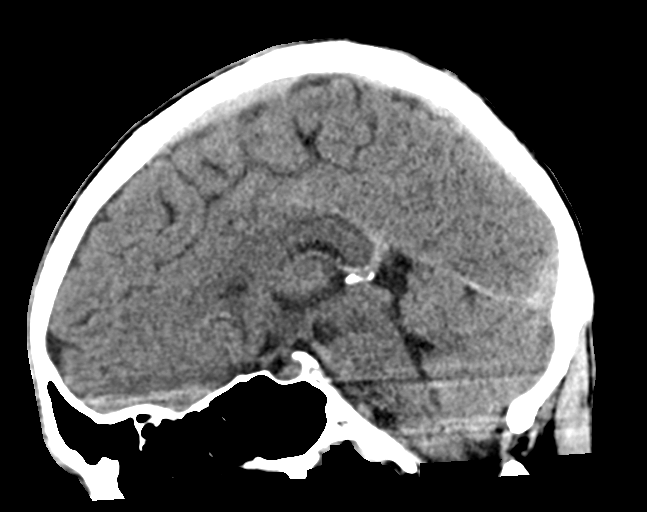
[im 37/55  brain]
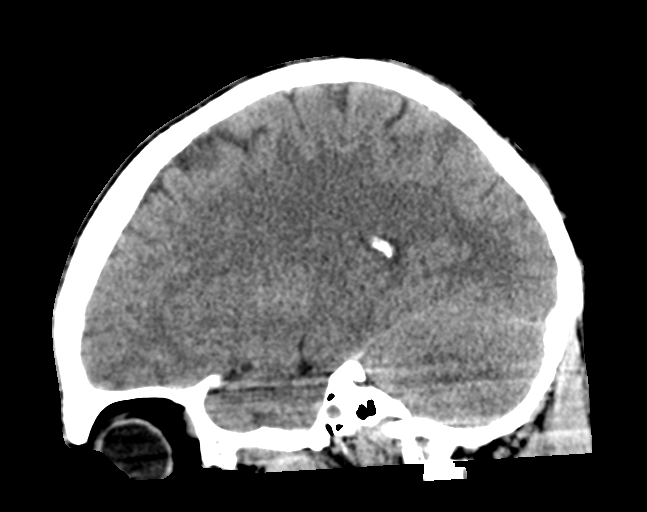

[16 of 47 positions shown; findings below may reference images not displayed]

FINDINGS: Brain: No evidence of acute infarction, hemorrhage, hydrocephalus,
extra-axial collection or mass lesion/mass effect.

Vascular: No hyperdense vessel or unexpected calcification.

Skull: Normal. Negative for fracture or focal lesion.

Sinuses/Orbits: No acute finding.

Other: Laceration over the left supraorbital region. Soft tissues
are otherwise normal.
IMPRESSION: No acute intracranial abnormality.

## 2018-09-03 ENCOUNTER — Encounter (HOSPITAL_COMMUNITY): Payer: Self-pay

## 2018-09-03 ENCOUNTER — Other Ambulatory Visit: Payer: Self-pay

## 2018-09-03 ENCOUNTER — Ambulatory Visit (HOSPITAL_COMMUNITY)
Admission: EM | Admit: 2018-09-03 | Discharge: 2018-09-03 | Disposition: A | Discharge status: Home / Self Care | Payer: Self-pay | Attending: Family Medicine | Admitting: Family Medicine

## 2018-09-03 DIAGNOSIS — S29012A Strain of muscle and tendon of back wall of thorax, initial encounter: Secondary | ICD-10-CM

## 2018-09-03 MED ORDER — HYDROCODONE-ACETAMINOPHEN 7.5-325 MG PO TABS: 1.0000 | ORAL_TABLET | Freq: Four times a day (QID) | ORAL | 0 refills | Status: DC | PRN

## 2018-09-03 MED ORDER — METHYLPREDNISOLONE 4 MG PO TBPK: ORAL_TABLET | ORAL | 0 refills | Status: DC

## 2018-09-03 MED ORDER — TIZANIDINE HCL 4 MG PO TABS: 4.0000 mg | ORAL_TABLET | Freq: Four times a day (QID) | ORAL | 0 refills | Status: DC | PRN

## 2018-09-03 NOTE — ED Provider Notes (Signed)
[facility]    CSN: [id number] Arrival date & time: 09/03/18  2706     History   Chief Complaint Chief Complaint  Patient presents with  . Back Pain    HPI  Kurt Bishop is a 33 y.o. male.   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  He states that he has been doing this job for about 18 months.  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  He normally tolerates this job without difficulty.  He states his last week his work has been more strenuous than usual.  He states that he has been on uneven surfaces that caused him to "whiplash" in his seat.  He states that because of the COVID-19 pandemic he has not been exercising or doing any work outside of the home.  He awoke this morning at 4 AM with severe pain in his left upper back.  He could not get back to sleep.  He took ibuprofen.  He has limitation in use of his left arm and in movement of his neck.  He called work and was told to be evaluated here for possible work-related injury. He does not have any pre-existing neck or back condition. Pain is in his left upper back to his neck.  It is between the shoulder blade of the spine.  It hurts with arm movement and with neck movement.  No numbness or weakness into the arms, normal grip strength and dexterity.  His wife drove him to the visit today, he felt unsafe trying to manage a vehicle.  Past Medical History:  Diagnosis Date  . ADD (attention deficit disorder)   . Complication of anesthesia    woke up slow & also reports that he passed out 3 times after getting home post hernia repair    . Family history of adverse reaction to anesthesia    passing out & nausea & vomiting - Mother   . Headache    uses excedrin 4-5 times per yr.   . Seizure-like activity (HCC)    associated with the vasovagal response, seeing blood, smashing thumb at work with a hammer    . Syncope,  vasovagal 09/2014   "passed out post op & again at work & taken to [facility] in [place]"    Patient Active Problem List   Diagnosis Date Noted  . ADD (attention deficit disorder)   . 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 HERNIA REPAIR Right   . INGUINAL HERNIA REPAIR  03/24/2012   Procedure: HERNIA REPAIR INGUINAL ADULT;  Surgeon: Fabio Bering, MD;  Location: [facility];  Service: General;  Laterality: Right;  . INGUINAL HERNIA REPAIR Left 04/29/2016   Procedure: LAPAROSCOPIC LEFT INGUINAL HERNIA REPAIR WITH MESH;  Surgeon: Axel Filler, MD;  Location: [facility];  Service: General;  Laterality: Left;  . INSERTION OF MESH Left 04/29/2016   Procedure: INSERTION OF MESH;  Surgeon: Axel Filler, MD;  [facility];  Service: General;  Laterality: Left;  Marland Kitchen MULTIPLE TOOTH EXTRACTIONS  05/2015   done with  local anesthesia, pt. awake         Home Medications    Prior to Admission medications   Medication Sig Start Date End Date Taking? Authorizing Provider  HYDROcodone-acetaminophen (NORCO) 7.5-325 MG tablet Take 1 tablet by mouth every 6 (six) hours  as needed for moderate pain. Take with food 09/03/18   Eustace MooreNelson, Joann Jorge Sue, MD  methylPREDNISolone (MEDROL DOSEPAK) 4 MG TBPK tablet tad 09/03/18   Eustace MooreNelson, Fiona Coto Sue, MD  tiZANidine (ZANAFLEX) 4 MG tablet Take 1-2 tablets (4-8 mg total) by mouth every 6 (six) hours as needed for muscle spasms. 09/03/18   Eustace MooreNelson, Bradrick Kamau Sue, MD    Family History Family History  Problem Relation Age of Onset  . Diabetes Paternal Grandmother   . Hypertension Paternal Grandmother     Social History Social History   Tobacco Use  . Smoking status: Former Smoker    Types: E-cigarettes    Last attempt to quit: 06/19/2012    Years since quitting: 6.2  . Smokeless tobacco: Former NeurosurgeonUser    Quit date: 06/18/2012  Substance Use Topics  . Alcohol use: Yes    Comment: beer every now and then  . Drug use: No      Allergies   Percocet [oxycodone-acetaminophen] and Amoxicillin   Review of Systems Review of Systems  Constitutional: Negative for chills and fever.  HENT: Negative for ear pain and sore throat.   Eyes: Negative for pain and visual disturbance.  Respiratory: Negative for cough and shortness of breath.   Cardiovascular: Negative for chest pain and palpitations.  Gastrointestinal: Negative for abdominal pain and vomiting.  Genitourinary: Negative for dysuria and hematuria.  Musculoskeletal: Positive for neck pain and neck stiffness. Negative for arthralgias and back pain.  Skin: Negative for color change and rash.  Neurological: Negative for seizures and syncope.  All other systems reviewed and are negative.    Physical Exam Triage Vital Signs ED Triage Vitals  Enc Vitals Group     BP 09/03/18 0846 121/77     Pulse Rate 09/03/18 0846 76     Resp 09/03/18 0846 18     Temp 09/03/18 0846 98.3 F (36.8 C)     Temp Source 09/03/18 0846 Oral     SpO2 09/03/18 0846 100 %     Weight --      Height --      Head Circumference --      Peak Flow --      Pain Score 09/03/18 0844 3     Pain Loc --      Pain Edu? --      Excl. in GC? --    No data found.  Updated Vital Signs BP 121/77 (BP Location: Left Arm)   Pulse 76   Temp 98.3 F (36.8 C) (Oral)   Resp 18   SpO2 100%   Visual Acuity Right Eye Distance:   Left Eye Distance:   Bilateral Distance:    Right Eye Near:   Left Eye Near:    Bilateral Near:     Physical Exam Constitutional:      General: He is not in acute distress.    Appearance: He is well-developed and normal weight.     Comments: Appears stiff and uncomfortable  HENT:     Head: Normocephalic and atraumatic.  Eyes:     Conjunctiva/sclera: Conjunctivae normal.     Pupils: Pupils are equal, round, and reactive to light.  Neck:     Musculoskeletal: Injury and muscular tenderness present.     Thyroid: No thyroid mass or thyromegaly.    Cardiovascular:     Rate and Rhythm: Normal rate.  Pulmonary:     Effort: Pulmonary effort is normal. No respiratory distress.  Abdominal:     General:  There is no distension.     Palpations: Abdomen is soft.  Musculoskeletal: Normal range of motion.     Comments: Strength, sensation, range of motion, reflexes are normal in both upper extremities.  No tenderness or lack of mobility in mid to lower back.  Lymphadenopathy:     Cervical: No cervical adenopathy.  Skin:    General: Skin is warm and dry.  Neurological:     General: No focal deficit present.     Mental Status: He is alert.     Sensory: No sensory deficit.     Motor: No weakness.     Coordination: Coordination normal.     Deep Tendon Reflexes: Reflexes normal.  Psychiatric:        Mood and Affect: Mood normal.        Behavior: Behavior normal.      UC Treatments / Results  Labs (all labs ordered are listed, but only abnormal results are displayed) Labs Reviewed - No data to display  EKG None  Radiology No results found.  Procedures Procedures (including critical care time)  Medications Ordered in UC Medications - No data to display  Initial Impression / Assessment and Plan / UC Course  I have reviewed the triage vital signs and the nursing notes.  Pertinent labs & imaging results that were available during my care of the patient were reviewed by me and considered in my medical decision making (see chart for details).     I discussed with patient that pain that occurs with normal work activity is not usually compensable.  Since he can identify an increase in the effort to do his job, this might have pulled/injured his neck and arm.  He will follow up with workers comp provider. Final Clinical Impressions(s) / UC Diagnoses   Final diagnoses:  Rhomboid muscle strain, initial encounter     Discharge Instructions     Activity as tolerated.  Avoid overhead and heavy use of the arm Ice to area for 20  minutes out of every 2-4 hours Gentle massage as tolerated Take Medrol Dosepak as directed.  Take all of day 1 today.  This is an anti-inflammatory medicine Take the tizanidine as needed as muscle relaxer. Take Norco if needed for severe pain.  This can cause drowsiness.  Do not take while working. Call your employer today to see about Worker's Comp. follow-up on Monday   ED Prescriptions    Medication Sig Dispense Auth. Provider   methylPREDNISolone (MEDROL DOSEPAK) 4 MG TBPK tablet tad 21 tablet Eustace Moore, MD   tiZANidine (ZANAFLEX) 4 MG tablet Take 1-2 tablets (4-8 mg total) by mouth every 6 (six) hours as needed for muscle spasms. 21 tablet Eustace Moore, MD   HYDROcodone-acetaminophen [facility]) 7.5-325 MG tablet Take 1 tablet by mouth every 6 (six) hours as needed for moderate pain. Take with food 15 tablet Eustace Moore, MD     Controlled Substance Prescriptions [place] Controlled Substance Registry consulted? Yes, I have consulted the [place] Controlled Substances Registry for this patient, and feel the risk/benefit ratio today is favorable for proceeding with this prescription for a controlled substance. Score zero   Eustace Moore, MD 09/03/18 1120

## 2018-09-03 NOTE — ED Triage Notes (Signed)
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 Patient states he does plan to file for workers' comp.

## 2018-09-03 NOTE — Discharge Instructions (Addendum)
Activity as tolerated.  Avoid overhead and heavy use of the arm Ice to area for 20 minutes out of every 2-4 hours Gentle massage as tolerated Take Medrol Dosepak as directed.  Take all of day 1 today.  This is an anti-inflammatory medicine Take the tizanidine as needed as muscle relaxer. Take Norco if needed for severe pain.  This can cause drowsiness.  Do not take while working. Call your employer today to see about Worker's Comp. follow-up on Monday

## 2019-08-08 ENCOUNTER — Other Ambulatory Visit: Payer: Self-pay

## 2019-08-08 ENCOUNTER — Ambulatory Visit (INDEPENDENT_AMBULATORY_CARE_PROVIDER_SITE_OTHER): Payer: Self-pay | Admitting: Family Medicine

## 2019-08-08 ENCOUNTER — Encounter: Payer: Self-pay | Admitting: Family Medicine

## 2019-08-08 VITALS — BP 138/82 | HR 76 | Temp 97.0°F | Resp 14 | Ht 70.5 in | Wt 139.0 lb

## 2019-08-08 DIAGNOSIS — R251 Tremor, unspecified: Secondary | ICD-10-CM

## 2019-08-08 DIAGNOSIS — F900 Attention-deficit hyperactivity disorder, predominantly inattentive type: Secondary | ICD-10-CM

## 2019-08-08 MED ORDER — LISDEXAMFETAMINE DIMESYLATE 60 MG PO CAPS: 60.0000 mg | ORAL_CAPSULE | ORAL | 0 refills | Status: DC

## 2019-08-08 NOTE — Progress Notes (Signed)
Subjective:    Patient ID: Kurt Bishop, male    DOB: 07/02/1985, 34 y.o.   MRN: [id number]  Patient is a very pleasant 34 year old Caucasian male who has a history of ADD.  In the past he has taken Vyvanse 60 mg a day which worked extremely well for him.  Without the medication he has a difficult time focusing at work.  He finds himself easily distracted.  He fails to complete assignments in a timely fashion.  He has a difficult time organizing his thoughts and also keeping track of his personal belongings.  He frequently forgets to do things that are asked of him.  This affects him both at home and at work.  While taking the medication, he finds himself able to focus for at least 8 to 10 hours a day.  He finds it easier to maintain his attention and complete his assigned tasks.  He also now has insurance he can resume the medication without a cost issue.  He also has been feeling extremely "shaky recently".  Patient states that if he goes without eating, he will start to feel shaky.  He will feel extremely weak.  He will feel lightheaded.  He will feel suddenly drained of energy.  He has to sit down and take a break and eat something full of sugar in order to feel better.  He denies any headaches or blurry vision.  He denies any palpitations or tachycardia.  He denies any abdominal pain or nausea or vomiting.  He denies any recent weight loss.  He denies any blurry vision or polyuria. Past Medical History:  Diagnosis Date  . ADD (attention deficit disorder)   . Complication of anesthesia    woke up slow & also reports that he passed out 3 times after getting home post hernia repair    . Family history of adverse reaction to anesthesia    passing out & nausea & vomiting - Mother   . Headache    uses excedrin 4-5 times per yr.   . Seizure-like activity ([place])    associated with the vasovagal response, seeing blood, smashing thumb at work with a hammer    . Syncope, vasovagal 09/2014   "passed out post op & again at work & taken to [facility] in [place]"   No current outpatient medications on file prior to visit.   No current facility-administered medications on file prior to visit.   Allergies  Allergen Reactions  . Percocet [Oxycodone-Acetaminophen] Nausea And Vomiting  . Amoxicillin Rash and Other (See Comments)    Caused rash with sun exposure per patient  Has patient had a PCN reaction causing immediate rash, facial/tongue/throat swelling, SOB or lightheadedness with hypotension:No Has patient had a PCN reaction causing severe rash involving mucus membranes or skin necrosis:No Has patient had a PCN reaction that required hospitalization:No Has patient had a PCN reaction occurring within the last 10 years:No If all of the above answers are "NO", then may proceed with Cephalosporin use.    Social History   Socioeconomic History  . Marital status: Married    Spouse name: Not on file  . Number of children: Not on file  . Years of education: Not on file  . Highest education level: Not on file  Occupational History  . Not on file  Tobacco Use  . Smoking status: Former Smoker    Types: E-cigarettes    Quit date: 06/19/2012    Years since quitting: 7.1  . Smokeless tobacco:  Former Neurosurgeon    Quit date: 06/18/2012  Substance and Sexual Activity  . Alcohol use: Yes    Comment: beer every now and then  . Drug use: No  . Sexual activity: Not on file  Other Topics Concern  . Not on file  Social History Narrative  . Not on file   Social Determinants of Health   Financial Resource Strain:   . Difficulty of Paying Living Expenses:   Food Insecurity:   . Worried About Programme researcher, broadcasting/film/video in the Last Year:   . Barista in the Last Year:   Transportation Needs:   . Freight forwarder (Medical):   Marland Kitchen Lack of Transportation (Non-Medical):   Physical Activity:   . Days of Exercise per Week:   . Minutes of Exercise per Session:   Stress:   . Feeling of  Stress :   Social Connections:   . Frequency of Communication with Friends and Family:   . Frequency of Social Gatherings with Friends and Family:   . Attends Religious Services:   . Active Member of Clubs or Organizations:   . Attends Banker Meetings:   Marland Kitchen Marital Status:   Intimate Partner Violence:   . Fear of Current or Ex-Partner:   . Emotionally Abused:   Marland Kitchen Physically Abused:   . Sexually Abused:      Review of Systems  All other systems reviewed and are negative.      Objective:   Physical Exam  Constitutional: He appears well-developed and well-nourished.  HENT:  Mouth/Throat: Abnormal dentition. Dental caries present.  Neck: No JVD present.  Cardiovascular: Normal rate, regular rhythm and normal heart sounds.  No murmur heard. Pulmonary/Chest: Effort normal and breath sounds normal. No respiratory distress. He has no wheezes. He has no rales.  Abdominal: Soft. Bowel sounds are normal. He exhibits no distension. There is no abdominal tenderness. There is no rebound and no guarding.  Musculoskeletal:        General: No edema.     Cervical back: Neck supple.  Lymphadenopathy:    He has no cervical adenopathy.  Vitals reviewed.         Assessment & Plan:  Shakiness - Plan: CBC with Differential/Platelet, COMPLETE METABOLIC PANEL WITH GFR, TSH  Attention deficit hyperactivity disorder (ADHD), predominantly inattentive type  I believe the patient's shakiness is likely due to hypoglycemia.  Explained the etiology of hypoglycemia.  I recommended that he eat a well-balanced diet rich in proteins and avoid junk food and carb rich foods that can cause him to crash later in the day.  I will also check a CBC, CMP, and TSH.  I will resume his Vyvanse 60 mg a day and reassess in 6 months or sooner if symptoms arise.

## 2019-08-09 LAB — COMPLETE METABOLIC PANEL WITH GFR
AG Ratio: 1.8 (calc) (ref 1.0–2.5)
ALT: 11 U/L (ref 9–46)
AST: 13 U/L (ref 10–40)
Albumin: 4.7 g/dL (ref 3.6–5.1)
Alkaline phosphatase (APISO): 47 U/L (ref 36–130)
BUN: 13 mg/dL (ref 7–25)
CO2: 29 mmol/L (ref 20–32)
Calcium: 10.2 mg/dL (ref 8.6–10.3)
Chloride: 101 mmol/L (ref 98–110)
Creat: 1.21 mg/dL (ref 0.60–1.35)
GFR, Est African American: 92 mL/min/{1.73_m2} (ref >=60)
GFR, Est Non African American: 79 mL/min/{1.73_m2} (ref >=60)
Globulin: 2.6 g/dL (calc) (ref 1.9–3.7)
Glucose, Bld: 79 mg/dL (ref 65–99)
Potassium: 4.6 mmol/L (ref 3.5–5.3)
Sodium: 139 mmol/L (ref 135–146)
Total Bilirubin: 2.3 mg/dL — ABNORMAL HIGH (ref 0.2–1.2)
Total Protein: 7.3 g/dL (ref 6.1–8.1)

## 2019-08-09 LAB — CBC WITH DIFFERENTIAL/PLATELET
Absolute Monocytes: 566 cells/uL (ref 200–950)
Basophils Absolute: 22 cells/uL (ref 0–200)
Basophils Relative: 0.4 %
Eosinophils Absolute: 190 cells/uL (ref 15–500)
Eosinophils Relative: 3.4 %
HCT: 50.3 % — ABNORMAL HIGH (ref 38.5–50.0)
Hemoglobin: 16.7 g/dL (ref 13.2–17.1)
Lymphs Abs: 1506 cells/uL (ref 850–3900)
MCH: 28.4 pg (ref 27.0–33.0)
MCHC: 33.2 g/dL (ref 32.0–36.0)
MCV: 85.5 fL (ref 80.0–100.0)
MPV: 10.2 fL (ref 7.5–12.5)
Monocytes Relative: 10.1 %
Neutro Abs: 3315 cells/uL (ref 1500–7800)
Neutrophils Relative %: 59.2 %
Platelets: 262 10*3/uL (ref 140–400)
RBC: 5.88 10*6/uL — ABNORMAL HIGH (ref 4.20–5.80)
RDW: 12.8 % (ref 11.0–15.0)
Total Lymphocyte: 26.9 %
WBC: 5.6 10*3/uL (ref 3.8–10.8)

## 2019-08-09 LAB — TSH: TSH: 0.65 mIU/L (ref 0.40–4.50)

## 2019-08-10 ENCOUNTER — Telehealth: Payer: Self-pay | Admitting: Family Medicine

## 2019-08-10 NOTE — Telephone Encounter (Signed)
Pt's wife called and states that the pt took his Vyvanse this AM and his heart rate is at 120 and has been all morning and he feels sob. Pt is currently at work and she is reading his heart rate off a fitbit app. I recommended that he go to the ER for evaluation and DC the Vyvanse until he can follow up with Dr. Tanya Bishop. She Kurt Bishop - pt's wife)  verbalizes understanding.

## 2019-08-11 ENCOUNTER — Ambulatory Visit
Admission: EM | Admit: 2019-08-11 | Discharge: 2019-08-11 | Disposition: A | Discharge status: Home / Self Care | Payer: BC Managed Care – PPO

## 2019-08-11 ENCOUNTER — Other Ambulatory Visit: Payer: Self-pay

## 2019-08-11 DIAGNOSIS — R6889 Other general symptoms and signs: Secondary | ICD-10-CM

## 2019-08-11 DIAGNOSIS — Z711 Person with feared health complaint in whom no diagnosis is made: Secondary | ICD-10-CM

## 2019-08-11 DIAGNOSIS — T887XXA Unspecified adverse effect of drug or medicament, initial encounter: Secondary | ICD-10-CM

## 2019-08-11 NOTE — Telephone Encounter (Signed)
Spoke to pt's wife and he is going to UC since he is off today and you have no openings. He wanted to be seen today d/t being off work today.

## 2019-08-11 NOTE — ED Provider Notes (Signed)
[facility]   [id number] 08/11/19 Arrival Time: 1100  CC: VS check; work note  SUBJECTIVE:  Kurt Bishop is a 34 y.o. male who presents for vital sign check.  States he took vyvanse on Monday and his HR became elevated into the 130s.  Has been on vyvanse in the past, but restarted this week.  Speculates the dose may have been too high for restarting his medication.  States HR has improved, but still reports some brain fog and slight headache.  Does not remember this occurring after DC'ed vyvanse in the past.  Was encouraged by PCP to come her for evaluation.  Patient denies vision changes, slurred speech, facial droop, chest pain, SOB, abdominal pain, weakness, numbness or tingling, slurred speech, abdominal pain, changes in bowel or bladder function.     ROS: As per HPI.  All other pertinent ROS negative.     Past Medical History:  Diagnosis Date  . ADD (attention deficit disorder)   . Complication of anesthesia    woke up slow & also reports that he passed out 3 times after getting home post hernia repair    . Family history of adverse reaction to anesthesia    passing out & nausea & vomiting - Mother   . Headache    uses excedrin 4-5 times per yr.   . Seizure-like activity (HCC)    associated with the vasovagal response, seeing blood, smashing thumb at work with a hammer    . Syncope, vasovagal 09/2014   "passed out post op & again at work & taken to [facility] in [place]"   Past Surgical History:  Procedure Laterality Date  . HERNIA REPAIR Right   . INGUINAL HERNIA REPAIR  03/24/2012   Procedure: HERNIA REPAIR INGUINAL ADULT;  Surgeon: Fabio Bering, MD;  Location: [facility];  Service: General;  Laterality: Right;  . INGUINAL HERNIA REPAIR Left 04/29/2016   Procedure: LAPAROSCOPIC LEFT INGUINAL HERNIA REPAIR WITH MESH;  Surgeon: Axel Filler, MD;  Location: [facility] OR;  Service: General;  Laterality: Left;  . INSERTION OF MESH Left 04/29/2016   Procedure: INSERTION  OF MESH;  Surgeon: Axel Filler, MD;  Location: [facility] OR;  Service: General;  Laterality: Left;  Marland Kitchen MULTIPLE TOOTH EXTRACTIONS  05/2015   done with  local anesthesia, pt. awake     Allergies  Allergen Reactions  . Percocet [Oxycodone-Acetaminophen] Nausea And Vomiting  . Amoxicillin Rash and Other (See Comments)    Caused rash with sun exposure per patient  Has patient had a PCN reaction causing immediate rash, facial/tongue/throat swelling, SOB or lightheadedness with hypotension:No Has patient had a PCN reaction causing severe rash involving mucus membranes or skin necrosis:No Has patient had a PCN reaction that required hospitalization:No Has patient had a PCN reaction occurring within the last 10 years:No If all of the above answers are "NO", then may proceed with Cephalosporin use.    No current facility-administered medications on file prior to encounter.   Current Outpatient Medications on File Prior to Encounter  Medication Sig Dispense Refill  . lisdexamfetamine (VYVANSE) 60 MG capsule Take 1 capsule (60 mg total) by mouth every morning. 30 capsule 0   Social History   Socioeconomic History  . Marital status: Married    Spouse name: Not on file  . Number of children: Not on file  . Years of education: Not on file  . Highest education level: Not on file  Occupational History  . Not on file  Tobacco Use  .  Smoking status: Former Smoker    Types: E-cigarettes    Quit date: 06/19/2012    Years since quitting: 7.1  . Smokeless tobacco: Former Systems developer    Quit date: 06/18/2012  Substance and Sexual Activity  . Alcohol use: Yes    Comment: beer every now and then  . Drug use: No  . Sexual activity: Not on file  Other Topics Concern  . Not on file  Social History Narrative  . Not on file   Social Determinants of Health   Financial Resource Strain:   . Difficulty of Paying Living Expenses:   Food Insecurity:   . Worried About Charity fundraiser in the Last Year:   .  Arboriculturist in the Last Year:   Transportation Needs:   . Film/video editor (Medical):   Marland Kitchen Lack of Transportation (Non-Medical):   Physical Activity:   . Days of Exercise per Week:   . Minutes of Exercise per Session:   Stress:   . Feeling of Stress :   Social Connections:   . Frequency of Communication with Friends and Family:   . Frequency of Social Gatherings with Friends and Family:   . Attends Religious Services:   . Active Member of Clubs or Organizations:   . Attends Archivist Meetings:   Marland Kitchen Marital Status:   Intimate Partner Violence:   . Fear of Current or Ex-Partner:   . Emotionally Abused:   Marland Kitchen Physically Abused:   . Sexually Abused:    Family History  Problem Relation Age of Onset  . Diabetes Paternal Grandmother   . Hypertension Paternal Grandmother     OBJECTIVE:  Vitals:   08/11/19 1112  BP: 125/84  Pulse: 80  Resp: 18  Temp: 98.4 F (36.9 C)  SpO2: 97%    General appearance: alert; no distress Eyes: PERRLA; EOMI HENT: normocephalic; atraumatic; PERRL, EOMI grossly; oropharynx clear Neck: supple with FROM Lungs: clear to auscultation bilaterally Heart: regular rate and rhythm.   Extremities: no edema; symmetrical with no gross deformities Skin: warm and dry Neurologic: CN 2-12 grossly intact; finger to nose without difficulty; normal gait; strength and sensation intact bilaterally about the upper and lower extremities; negative pronator drift Psychological: alert and cooperative; normal mood and affect   ASSESSMENT & PLAN:  1. Physically well but worried   2. Abnormal vital signs   3. Medication side effect    Vital signs and exam reassuring Rest and push fluids Take OTC ibuprofen and/or tylenol as needed for pain Follow up with PCP as needed Return or go to the ER if you have any new or worsening symptoms such as fever, chills, nausea, vomiting, chest pain, shortness of breath, cough, vision changes, worsening headache  despite treatment, slurred speech, facial asymmetry, weakness in arms or legs, chest pain, SOB etc...  Reviewed expectations re: course of current medical issues. Questions answered. Outlined signs and symptoms indicating need for more acute intervention. Patient verbalized understanding. After Visit Summary given.   Lestine Box, PA-C 08/11/19 1220

## 2019-08-11 NOTE — Discharge Instructions (Signed)
Vital signs and exam reassuring Rest and push fluids Take OTC ibuprofen and/or tylenol as needed for pain Follow up with PCP as needed Return or go to the ER if you have any new or worsening symptoms such as fever, chills, nausea, vomiting, chest pain, shortness of breath, cough, vision changes, worsening headache despite treatment, slurred speech, facial asymmetry, weakness in arms or legs, chest pain, SOB etc..Marland Kitchen

## 2019-08-11 NOTE — ED Triage Notes (Signed)
Pt began taking vyvance on Monday and began having racing heart rate up to 130 without activity , pt called primary and encouraged to be seen at ED or UC. Pt has not taken medication yesterday or today but states he has "brain fog"

## 2019-08-11 NOTE — Telephone Encounter (Signed)
NTBS, is he better off vyvanse (he did not go to er).

## 2020-01-12 ENCOUNTER — Ambulatory Visit
Admission: EM | Admit: 2020-01-12 | Discharge: 2020-01-12 | Disposition: A | Discharge status: Home / Self Care | Payer: BC Managed Care – PPO | Attending: Emergency Medicine | Admitting: Emergency Medicine

## 2020-01-12 ENCOUNTER — Other Ambulatory Visit: Payer: Self-pay

## 2020-01-12 DIAGNOSIS — R6889 Other general symptoms and signs: Secondary | ICD-10-CM

## 2020-01-12 DIAGNOSIS — Z1152 Encounter for screening for COVID-19: Secondary | ICD-10-CM

## 2020-01-12 DIAGNOSIS — Z20822 Contact with and (suspected) exposure to covid-19: Secondary | ICD-10-CM

## 2020-01-12 MED ORDER — MELOXICAM 15 MG PO TABS: 15.0000 mg | ORAL_TABLET | Freq: Every day | ORAL | 0 refills | Status: DC

## 2020-01-12 NOTE — ED Triage Notes (Signed)
Pt has c/o headache, nausea and fatigue that began this morning

## 2020-01-12 NOTE — Discharge Instructions (Signed)
COVID testing ordered.  It will take between 2-5 days for test results.  Someone will contact you regarding abnormal results.    In the meantime: You should remain isolated in your home for 10 days from symptom onset AND greater than 72 hours after symptoms resolution (absence of fever without the use of fever-reducing medication and improvement in respiratory symptoms), whichever is longer Get plenty of rest and push fluids Mobic for pain Use OTC zyrtec for nasal congestion, runny nose, and/or sore throat Use OTC flonase for nasal congestion and runny nose Use medications daily for symptom relief Use OTC medications like tylenol as needed fever or pain Call or go to the ED if you have any new or worsening symptoms such as fever, worsening cough, shortness of breath, chest tightness, chest pain, turning blue, changes in mental status, etc..Marland Kitchen

## 2020-01-12 NOTE — ED Provider Notes (Signed)
[facility]   [id number] 01/12/20 Arrival Time: 7425   CC: COVID symptoms  SUBJECTIVE: History from: patient.  Kurt Bishop is a 34 y.o. male who presents with headache, nausea, and fatigue that began this morning.  Denies sick exposure to COVID, flu or strep. Has tried OTC excedrin with relief.  Denies aggravating factors.  Denies previous COVID infection in the past.   Denies fever, chills, sinus pain, rhinorrhea, sore throat, SOB, wheezing, chest pain, changes in bowel or bladder habits.    ROS: As per HPI.  All other pertinent ROS negative.     Past Medical History:  Diagnosis Date  . ADD (attention deficit disorder)   . Complication of anesthesia    woke up slow & also reports that he passed out 3 times after getting home post hernia repair    . Family history of adverse reaction to anesthesia    passing out & nausea & vomiting - Mother   . Headache    uses excedrin 4-5 times per yr.   . Seizure-like activity (HCC)    associated with the vasovagal response, seeing blood, smashing thumb at work with a hammer    . Syncope, vasovagal 09/2014   "passed out post op & again at work & taken to [facility] in [place]"   Past Surgical History:  Procedure Laterality Date  . HERNIA REPAIR Right   . INGUINAL HERNIA REPAIR  03/24/2012   Procedure: HERNIA REPAIR INGUINAL ADULT;  Surgeon: Fabio Bering, MD;  Location: [facility];  Service: General;  Laterality: Right;  . INGUINAL HERNIA REPAIR Left 04/29/2016   Procedure: LAPAROSCOPIC LEFT INGUINAL HERNIA REPAIR WITH MESH;  Surgeon: Axel Filler, MD;  Location: [facility] OR;  Service: General;  Laterality: Left;  . INSERTION OF MESH Left 04/29/2016   Procedure: INSERTION OF MESH;  Surgeon: Axel Filler, MD;  Location: [facility] OR;  Service: General;  Laterality: Left;  Marland Kitchen MULTIPLE TOOTH EXTRACTIONS  05/2015   done with  local anesthesia, pt. awake     Allergies  Allergen Reactions  . Percocet [Oxycodone-Acetaminophen] Nausea And  Vomiting  . Amoxicillin Rash and Other (See Comments)    Caused rash with sun exposure per patient  Has patient had a PCN reaction causing immediate rash, facial/tongue/throat swelling, SOB or lightheadedness with hypotension:No Has patient had a PCN reaction causing severe rash involving mucus membranes or skin necrosis:No Has patient had a PCN reaction that required hospitalization:No Has patient had a PCN reaction occurring within the last 10 years:No If all of the above answers are "NO", then may proceed with Cephalosporin use.    No current facility-administered medications on file prior to encounter.   Current Outpatient Medications on File Prior to Encounter  Medication Sig Dispense Refill  . [DISCONTINUED] lisdexamfetamine (VYVANSE) 60 MG capsule Take 1 capsule (60 mg total) by mouth every morning. 30 capsule 0   Social History   Socioeconomic History  . Marital status: Married    Spouse name: Not on file  . Number of children: Not on file  . Years of education: Not on file  . Highest education level: Not on file  Occupational History  . Not on file  Tobacco Use  . Smoking status: Former Smoker    Types: E-cigarettes    Quit date: 06/19/2012    Years since quitting: 7.5  . Smokeless tobacco: Former Neurosurgeon    Quit date: 06/18/2012  Vaping Use  . Vaping Use: Never used  Substance and Sexual Activity  .  Alcohol use: Yes    Comment: beer every now and then  . Drug use: No  . Sexual activity: Not on file  Other Topics Concern  . Not on file  Social History Narrative  . Not on file   Social Determinants of Health   Financial Resource Strain:   . Difficulty of Paying Living Expenses: Not on file  Food Insecurity:   . Worried About Programme researcher, broadcasting/film/video in the Last Year: Not on file  . Ran Out of Food in the Last Year: Not on file  Transportation Needs:   . Lack of Transportation (Medical): Not on file  . Lack of Transportation (Non-Medical): Not on file  Physical  Activity:   . Days of Exercise per Week: Not on file  . Minutes of Exercise per Session: Not on file  Stress:   . Feeling of Stress : Not on file  Social Connections:   . Frequency of Communication with Friends and Family: Not on file  . Frequency of Social Gatherings with Friends and Family: Not on file  . Attends Religious Services: Not on file  . Active Member of Clubs or Organizations: Not on file  . Attends Banker Meetings: Not on file  . Marital Status: Not on file  Intimate Partner Violence:   . Fear of Current or Ex-Partner: Not on file  . Emotionally Abused: Not on file  . Physically Abused: Not on file  . Sexually Abused: Not on file   Family History  Problem Relation Age of Onset  . Diabetes Paternal Grandmother   . Hypertension Paternal Grandmother     OBJECTIVE:  Vitals:   01/12/20 1013  BP: 115/77  Pulse: (!) 58  Resp: 20  Temp: 98.3 F (36.8 C)  SpO2: 96%     General appearance: alert; well-appearing, nontoxic; speaking in full sentences and tolerating own secretions HEENT: NCAT; Ears: EACs clear, TMs pearly gray; Eyes: PERRL.  EOM grossly intact.Nose: nares patent without rhinorrhea, Throat: oropharynx clear, tonsils non erythematous or enlarged, uvula midline  Neck: supple without LAD Lungs: unlabored respirations, symmetrical air entry; cough: absent; no respiratory distress; CTAB Heart: regular rate and rhythm.  Skin: warm and dry Psychological: alert and cooperative; normal mood and affect   ASSESSMENT & PLAN:  1. Encounter for screening for COVID-19   2. Flu-like symptoms   3. Suspected COVID-19 virus infection    COVID testing ordered.  It will take between 2-5 days for test results.  Someone will contact you regarding abnormal results.    In the meantime: You should remain isolated in your home for 10 days from symptom onset AND greater than 72 hours after symptoms resolution (absence of fever without the use of fever-reducing  medication and improvement in respiratory symptoms), whichever is longer Get plenty of rest and push fluids Mobic for pain Use OTC zyrtec for nasal congestion, runny nose, and/or sore throat Use OTC flonase for nasal congestion and runny nose Use medications daily for symptom relief Use OTC medications like tylenol as needed fever or pain Call or go to the ED if you have any new or worsening symptoms such as fever, worsening cough, shortness of breath, chest tightness, chest pain, turning blue, changes in mental status, etc...   Reviewed expectations re: course of current medical issues. Questions answered. Outlined signs and symptoms indicating need for more acute intervention. Patient verbalized understanding. After Visit Summary given.         Rennis Harding, PA-C 01/12/20  1029  

## 2020-01-14 LAB — SARS-COV-2, NAA 2 DAY TAT

## 2020-01-14 LAB — NOVEL CORONAVIRUS, NAA: SARS-CoV-2, NAA: NOT DETECTED

## 2020-10-09 ENCOUNTER — Encounter: Payer: Self-pay | Admitting: Emergency Medicine

## 2020-10-09 ENCOUNTER — Ambulatory Visit (INDEPENDENT_AMBULATORY_CARE_PROVIDER_SITE_OTHER): Payer: Managed Care, Other (non HMO)

## 2020-10-09 ENCOUNTER — Ambulatory Visit
Admission: EM | Admit: 2020-10-09 | Discharge: 2020-10-09 | Disposition: A | Discharge status: Home / Self Care | Payer: Managed Care, Other (non HMO) | Attending: Family Medicine | Admitting: Family Medicine

## 2020-10-09 ENCOUNTER — Other Ambulatory Visit: Payer: Self-pay

## 2020-10-09 DIAGNOSIS — M779 Enthesopathy, unspecified: Secondary | ICD-10-CM

## 2020-10-09 DIAGNOSIS — M79644 Pain in right finger(s): Secondary | ICD-10-CM

## 2020-10-09 MED ORDER — PREDNISONE 10 MG (21) PO TBPK: ORAL_TABLET | Freq: Every day | ORAL | 0 refills | Status: AC

## 2020-10-09 NOTE — ED Triage Notes (Signed)
Right index finger pain. States he jammed finger 3 to 6 months ago and pain went away.  States he hit finger again and now has pain.

## 2020-10-09 NOTE — Discharge Instructions (Signed)
I have sent in a prednisone taper for you to take for 6 days. 6 tablets on day one, 5 tablets on day two, 4 tablets on day three, 3 tablets on day four, 2 tablets on day five, and 1 tablet on day six.  May take 800 mg ibuprofen with 1000 mg of Tylenol.  Do not exceed 4000 mg of Tylenol in 24 hours.  May use ice to the area as needed  Follow up with this office or with primary care if symptoms are persisting.  Follow up in the ER for high fever, trouble swallowing, trouble breathing, other concerning symptoms.

## 2020-10-09 NOTE — ED Provider Notes (Signed)
[facility]    CSN: [id number] Arrival date & time: 10/09/20  0806      History   Chief Complaint No chief complaint on file.   HPI Kurt Bishop is a 35 y.o. male.   Reports right index finger pain for the last couple of days.  Reports that he jammed his finger about 6 months ago, that it was sore and then the pain went away.  States that he hit his finger and now has shooting pain across the medial aspect of his right index finger.  Has taken ibuprofen with no relief.  Pain is intermittent and aggravated with activity.  Denies numbness, tingling, radiating pain, other injury, other symptoms.  ROS per HPI  The history is provided by the patient.    Past Medical History:  Diagnosis Date  . ADD (attention deficit disorder)   . Complication of anesthesia    woke up slow & also reports that he passed out 3 times after getting home post hernia repair    . Family history of adverse reaction to anesthesia    passing out & nausea & vomiting - Mother   . Headache    uses excedrin 4-5 times per yr.   . Seizure-like activity (HCC)    associated with the vasovagal response, seeing blood, smashing thumb at work with a hammer    . Syncope, vasovagal 09/2014   "passed out post op & again at work & taken to [facility] in [place]"    Patient Active Problem List   Diagnosis Date Noted  . ADD (attention deficit disorder)   . 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 HERNIA REPAIR Right   . INGUINAL HERNIA REPAIR  03/24/2012   Procedure: HERNIA REPAIR INGUINAL ADULT;  Surgeon: Fabio Bering, MD;  Location: [facility];  Service: General;  Laterality: Right;  . INGUINAL HERNIA REPAIR Left 04/29/2016   Procedure: LAPAROSCOPIC LEFT INGUINAL HERNIA REPAIR WITH MESH;  Surgeon: Axel Filler, MD;  Location: [facility] OR;  Service: General;  Laterality: Left;  . INSERTION OF MESH Left 04/29/2016   Procedure: INSERTION OF MESH;  Surgeon: Axel Filler, MD;  Location: [facility] OR;  Service: General;  Laterality: Left;  Marland Kitchen MULTIPLE TOOTH EXTRACTIONS  05/2015   done with  local anesthesia, pt. awake         Home Medications    Prior to Admission medications   Medication Sig Start Date End Date Taking? Authorizing Provider  predniSONE (STERAPRED UNI-PAK 21 TAB) 10 MG (21) TBPK tablet Take by mouth daily for 6 days. Take 6 tablets on day 1, 5 tablets on day 2, 4 tablets on day 3, 3 tablets on day 4, 2 tablets on day 5, 1 tablet on day 6 10/09/20 10/15/20 Yes Moshe Cipro, NP  meloxicam (MOBIC) 15 MG tablet Take 1 tablet (15 mg total) by mouth daily. 01/12/20   Wurst, [place], PA-C  lisdexamfetamine (VYVANSE) 60 MG capsule Take 1 capsule (60 mg total) by mouth every morning. 08/08/19 01/12/20  Donita Brooks, MD    Family History Family History  Problem Relation Age of Onset  . Diabetes Paternal Grandmother   . Hypertension Paternal Grandmother     Social History Social History   Tobacco Use  . Smoking status: Former Smoker    Types: E-cigarettes    Quit date: 06/19/2012    Years since quitting: 8.3  . Smokeless tobacco: Former Careers information officer  date: 06/18/2012  Vaping Use  . Vaping Use: Never used  Substance Use Topics  . Alcohol use: Yes    Comment: beer every now and then  . Drug use: No     Allergies   Percocet [oxycodone-acetaminophen]   Review of Systems Review of Systems   Physical Exam Triage Vital Signs ED Triage Vitals  Enc Vitals Group     BP 10/09/20 0812 130/86     Pulse Rate 10/09/20 0812 63     Resp 10/09/20 0812 18     Temp 10/09/20 0812 98.1 F (36.7 C)     Temp Source 10/09/20 0812 Oral     SpO2 10/09/20 0812 98 %     Weight --      Height --      Head Circumference --      Peak Flow --      Pain Score 10/09/20 0813 0     Pain Loc --      Pain Edu? --      Excl. in GC? --    No data found.  Updated Vital Signs BP 130/86 (BP Location: Right Arm)   Pulse 63   Temp 98.1 F (36.7  C) (Oral)   Resp 18   SpO2 98%   Visual Acuity Right Eye Distance:   Left Eye Distance:   Bilateral Distance:    Right Eye Near:   Left Eye Near:    Bilateral Near:     Physical Exam Vitals and nursing note reviewed.  Constitutional:      General: He is not in acute distress.    Appearance: Normal appearance. He is well-developed and normal weight. He is not ill-appearing.  HENT:     Head: Normocephalic and atraumatic.     Nose: Nose normal.     Mouth/Throat:     Mouth: Mucous membranes are moist.     Pharynx: Oropharynx is clear.  Eyes:     Extraocular Movements: Extraocular movements intact.     Conjunctiva/sclera: Conjunctivae normal.     Pupils: Pupils are equal, round, and reactive to light.  Cardiovascular:     Rate and Rhythm: Normal rate and regular rhythm.  Pulmonary:     Effort: Pulmonary effort is normal.  Abdominal:     Tenderness: There is no abdominal tenderness.  Musculoskeletal:        General: Tenderness (Medial aspect of right index finger) present. Normal range of motion.     Cervical back: Normal range of motion and neck supple.  Skin:    General: Skin is warm and dry.     Capillary Refill: Capillary refill takes less than 2 seconds.  Neurological:     General: No focal deficit present.     Mental Status: He is alert and oriented to person, place, and time.  Psychiatric:        Mood and Affect: Mood normal.        Behavior: Behavior normal.        Thought Content: Thought content normal.      UC Treatments / Results  Labs (all labs ordered are listed, but only abnormal results are displayed) Labs Reviewed - No data to display  EKG   Radiology DG Finger Index Right  Result Date: 10/09/2020 CLINICAL DATA:  Right index finger pain. EXAM: RIGHT INDEX FINGER 2+V COMPARISON:  No recent. FINDINGS: No acute bony or joint abnormality. Soft tissues are unremarkable. No radiopaque foreign body. IMPRESSION: No acute abnormality. Electronically  Signed   By: Maisie Fus  Register   On: 10/09/2020 08:31    Procedures Procedures (including critical care time)  Medications Ordered in UC Medications - No data to display  Initial Impression / Assessment and Plan / UC Course  I have reviewed the triage vital signs and the nursing notes.  Pertinent labs & imaging results that were available during my care of the patient were reviewed by me and considered in my medical decision making (see chart for details).    Right finger pain Tendinitis  X-ray negative today for fracture or misalignment Steroid taper prescribed May take 800 mg ibuprofen with 1000 mg of Tylenol.  Do not exceed 4000 mg of Tylenol in 24 hours. Follow up with this office or with primary care if symptoms are persisting.  Follow up in the ER for high fever, trouble swallowing, trouble breathing, other concerning symptoms.   Final Clinical Impressions(s) / UC Diagnoses   Final diagnoses:  Finger pain, right  Tendonitis     Discharge Instructions     I have sent in a prednisone taper for you to take for 6 days. 6 tablets on day one, 5 tablets on day two, 4 tablets on day three, 3 tablets on day four, 2 tablets on day five, and 1 tablet on day six.  May take 800 mg ibuprofen with 1000 mg of Tylenol.  Do not exceed 4000 mg of Tylenol in 24 hours.  May use ice to the area as needed  Follow up with this office or with primary care if symptoms are persisting.  Follow up in the ER for high fever, trouble swallowing, trouble breathing, other concerning symptoms.     ED Prescriptions    Medication Sig Dispense Auth. Provider   predniSONE (STERAPRED UNI-PAK 21 TAB) 10 MG (21) TBPK tablet Take by mouth daily for 6 days. Take 6 tablets on day 1, 5 tablets on day 2, 4 tablets on day 3, 3 tablets on day 4, 2 tablets on day 5, 1 tablet on day 6 21 tablet Moshe Cipro, NP     PDMP not reviewed this encounter.   Moshe Cipro, NP 10/09/20 [phone]

## 2020-12-04 ENCOUNTER — Ambulatory Visit
Admission: EM | Admit: 2020-12-04 | Discharge: 2020-12-04 | Disposition: A | Discharge status: Home / Self Care | Payer: Managed Care, Other (non HMO)

## 2020-12-04 ENCOUNTER — Encounter: Payer: Self-pay | Admitting: Emergency Medicine

## 2020-12-04 ENCOUNTER — Other Ambulatory Visit: Payer: Self-pay

## 2020-12-04 DIAGNOSIS — S39012A Strain of muscle, fascia and tendon of lower back, initial encounter: Secondary | ICD-10-CM

## 2020-12-04 DIAGNOSIS — M5410 Radiculopathy, site unspecified: Secondary | ICD-10-CM

## 2020-12-04 DIAGNOSIS — S20229A Contusion of unspecified back wall of thorax, initial encounter: Secondary | ICD-10-CM

## 2020-12-04 MED ORDER — TIZANIDINE HCL 4 MG PO TABS: 4.0000 mg | ORAL_TABLET | Freq: Four times a day (QID) | ORAL | 0 refills | Status: DC | PRN

## 2020-12-04 MED ORDER — NAPROXEN 500 MG PO TABS: 500.0000 mg | ORAL_TABLET | Freq: Two times a day (BID) | ORAL | 0 refills | Status: DC

## 2020-12-04 NOTE — ED Triage Notes (Addendum)
Patient c/o mid to LFT sided lower back injury that occurred yesterday.   Patient states " this happened at work when the machine I was seated in fell into a hole".   Patient endorses increased pain with sitting for long periods of time.   Patient was seen by another provider today and diagnosed with a bone contusion.   Patient endorses "sharp pain gets sent down LFT leg down to my knee".   Patient has taken 800 mg ibuprofen and Biofreeze patch with no relief of symptoms.
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
## 2021-02-22 ENCOUNTER — Other Ambulatory Visit: Payer: Self-pay

## 2021-02-22 ENCOUNTER — Telehealth: Payer: Managed Care, Other (non HMO) | Admitting: Family Medicine

## 2021-02-22 DIAGNOSIS — F5101 Primary insomnia: Secondary | ICD-10-CM | POA: Diagnosis not present

## 2021-02-22 DIAGNOSIS — F900 Attention-deficit hyperactivity disorder, predominantly inattentive type: Secondary | ICD-10-CM | POA: Diagnosis not present

## 2021-02-22 DIAGNOSIS — G43009 Migraine without aura, not intractable, without status migrainosus: Secondary | ICD-10-CM | POA: Diagnosis not present

## 2021-02-22 MED ORDER — TRAZODONE HCL 50 MG PO TABS: 25.0000 mg | ORAL_TABLET | Freq: Every evening | ORAL | 3 refills | Status: DC | PRN

## 2021-02-22 MED ORDER — SUMATRIPTAN SUCCINATE 50 MG PO TABS: 50.0000 mg | ORAL_TABLET | Freq: Every day | ORAL | 3 refills | Status: DC | PRN

## 2021-02-22 MED ORDER — SUMATRIPTAN SUCCINATE 50 MG PO TABS: 50.0000 mg | ORAL_TABLET | ORAL | 3 refills | Status: DC | PRN

## 2021-02-22 MED ORDER — LISDEXAMFETAMINE DIMESYLATE 30 MG PO CAPS: 30.0000 mg | ORAL_CAPSULE | Freq: Every day | ORAL | 0 refills | Status: DC

## 2021-02-22 NOTE — Addendum Note (Signed)
Addended by: Phillips Odor on: 02/22/2021 04:11 PM   Modules accepted: Orders

## 2021-02-22 NOTE — Progress Notes (Signed)
Subjective:    Patient ID: Kurt Bishop, male    DOB: 24-Oct-1985, 35 y.o.   MRN: [id number]  HPI  Patient is being seen today as a telephone visit.  He is currently at work.  I am currently in my office.  Phone call began at 959.  Phone call concluded at 1015.  He consents to be seen via telephone.  He has 3 issues.  #1 he would like to resume his medication for ADD.  In the past he was taking Vyvanse 60 mg a day.  He tried resuming that last year.  However at the high-dose, he developed a racing heartbeat, no appetite, and insomnia.  He states that he was going to bed at 11:00 and waking up at 3 AM.  He will wake up 7 times throughout the night unable to sleep.  He thinks that dose was just too high for him.  He is now operating heavy equipment.  He has a 12-hour shift and a 1 hour commute both ways.  Therefore he has a 14-hour workday.  He finds himself easily distracted and unable to focus at work without the medication.  However he wants to try a much lower dose.  He denies any chest pain shortness of breath or dyspnea on exertion.  Problem #2, he reports insomnia.  He states that he lays in bed often for 2 or 3 hours before he is able to fall asleep.  His mind wanders keeping him awake.  He denies any racing thoughts or intrusive thoughts.  However he does feel anxious when he is unable to fall asleep and that creates pressure that keeps him awake.  He wants to avoid medications that are habit-forming.  #3 he has migraines.  He gets 1-2 a month.  He would like to resume his Imitrex.  In the past he took 50 mg. Past Medical History:  Diagnosis Date   ADD (attention deficit disorder)    Complication of anesthesia    woke up slow & also reports that he passed out 3 times after getting home post hernia repair     Family history of adverse reaction to anesthesia    passing out & nausea & vomiting - Mother    Headache    uses excedrin 4-5 times per yr.    Seizure-like activity (HCC)     associated with the vasovagal response, seeing blood, smashing thumb at work with a hammer     Syncope, vasovagal 09/2014   "passed out post op & again at work & taken to [facility] in [place]"   Past Surgical History:  Procedure Laterality Date   HERNIA REPAIR Right    INGUINAL HERNIA REPAIR  03/24/2012   Procedure: HERNIA REPAIR INGUINAL ADULT;  Surgeon: Fabio Bering, MD;  Location: [facility];  Service: General;  Laterality: Right;   INGUINAL HERNIA REPAIR Left 04/29/2016   Procedure: LAPAROSCOPIC LEFT INGUINAL HERNIA REPAIR WITH MESH;  Surgeon: Axel Filler, MD;  Location[facility];  Service: General;  Laterality: Left;   INSERTION OF MESH Left 04/29/2016   Procedure: INSERTION OF MESH;  Surgeon: Axel Filler, MD;  Location: [facility];  Service: General;  Laterality: Left;   MULTIPLE TOOTH EXTRACTIONS  05/2015   done with  local anesthesia, pt. awake     Current Outpatient Medications on File Prior to Visit  Medication Sig Dispense Refill   meloxicam (MOBIC) 15 MG tablet Take 1 tablet (15 mg total) by mouth daily. 30 tablet 0  naproxen (NAPROSYN) 500 MG tablet Take 1 tablet (500 mg total) by mouth 2 (two) times daily with a meal. 30 tablet 0   tiZANidine (ZANAFLEX) 4 MG tablet Take 1 tablet (4 mg total) by mouth every 6 (six) hours as needed for muscle spasms. 30 tablet 0   No current facility-administered medications on file prior to visit.   Allergies  Allergen Reactions   Percocet [Oxycodone-Acetaminophen] Nausea And Vomiting   .soc   Review of Systems  All other systems reviewed and are negative.     Objective:   Physical Exam        Assessment & Plan:   Attention deficit hyperactivity disorder (ADHD), predominantly inattentive type  Primary insomnia  Migraine without aura and without status migrainosus, not intractable Resume Vyvanse 30 mg a day and reassess in 1 month or sooner if worse.  I will give the patient trazodone 50 mg tablets and he can take 1/2 to 1  tablet at night to help him sleep.  I did send in a prescription for Imitrex 50 mg tablets.  He can 1 take 1 tablet and repeat in 2 hours if headache is persistent

## 2021-04-04 ENCOUNTER — Other Ambulatory Visit: Payer: Self-pay | Admitting: Family Medicine

## 2021-04-04 ENCOUNTER — Encounter: Payer: Self-pay | Admitting: Family Medicine

## 2021-04-04 ENCOUNTER — Other Ambulatory Visit: Payer: Self-pay

## 2021-04-04 MED ORDER — LISDEXAMFETAMINE DIMESYLATE 30 MG PO CAPS: 30.0000 mg | ORAL_CAPSULE | Freq: Every day | ORAL | 0 refills | Status: DC

## 2021-04-04 NOTE — Telephone Encounter (Signed)
LOV 03/25/21 (video) Last refill 02/22/21, #30, 0 refills  Please review, thanks!

## 2021-04-15 ENCOUNTER — Other Ambulatory Visit: Payer: Self-pay

## 2021-04-15 MED ORDER — TRAZODONE HCL 50 MG PO TABS: 25.0000 mg | ORAL_TABLET | Freq: Every evening | ORAL | 3 refills | Status: DC | PRN

## 2021-04-28 ENCOUNTER — Telehealth: Payer: Managed Care, Other (non HMO) | Admitting: Emergency Medicine

## 2021-04-28 DIAGNOSIS — U071 COVID-19: Secondary | ICD-10-CM | POA: Diagnosis not present

## 2021-04-28 MED ORDER — BENZONATATE 100 MG PO CAPS: 100.0000 mg | ORAL_CAPSULE | Freq: Two times a day (BID) | ORAL | 0 refills | Status: DC | PRN

## 2021-04-28 MED ORDER — MOLNUPIRAVIR EUA 200MG CAPSULE
4.0000 | ORAL_CAPSULE | Freq: Two times a day (BID) | ORAL | 0 refills | Status: AC
Start: 2021-04-28 — End: 2021-05-03

## 2021-04-28 NOTE — Progress Notes (Signed)
Virtual Visit Consent   Kurt Bishop, you are scheduled for a virtual visit with a [facility] provider today.     Just as with appointments in the office, your consent must be obtained to participate.  Your consent will be active for this visit and any virtual visit you may have with one of our providers in the next 365 days.     If you have a MyChart account, a copy of this consent can be sent to you electronically.  All virtual visits are billed to your insurance company just like a traditional visit in the office.    As this is a virtual visit, video technology does not allow for your provider to perform a traditional examination.  This may limit your provider's ability to fully assess your condition.  If your provider identifies any concerns that need to be evaluated in person or the need to arrange testing (such as labs, EKG, etc.), we will make arrangements to do so.     Although advances in technology are sophisticated, we cannot ensure that it will always work on either your end or our end.  If the connection with a video visit is poor, the visit may have to be switched to a telephone visit.  With either a video or telephone visit, we are not always able to ensure that we have a secure connection.     I need to obtain your verbal consent now.   Are you willing to proceed with your visit today? Yes   Kurt Bishop has provided verbal consent on 04/28/2021 for a virtual visit (video or telephone).   Rennis Harding, [place]   Date: 04/28/2021 7:40 PM   Virtual Visit via Video Note   I, Rennis Harding, connected with  Kurt Bishop  ([id number], 04-Jan-1986) on 04/28/21 at  7:45 PM EST by a video-enabled telemedicine application and verified that I am speaking with the correct person using two identifiers.  Location: Patient: Virtual Visit Location Patient: Home Provider: Virtual Visit Location Provider: Home Office   I discussed the limitations of evaluation  and management by telemedicine and the availability of in person appointments. The patient expressed understanding and agreed to proceed.    History of Present Illness: Kurt Bishop is a 35 y.o. who identifies as a male who was assigned male at birth, and is being seen today for productive cough, runny nose, congestion, fatigue, headache and low grade fevers x 2 days.  Admits to positive covid exposure at home.  Has tried OTC medications without relief.  Denies aggravating factors.  Reports previous symptoms in the past with covid infection.   Denies chills, SOB, wheezing, chest pain, nausea, changes in bowel or bladder habits.    ROS: As per HPI.  All other pertinent ROS negative.      HPI: HPI  Problems:  Patient Active Problem List   Diagnosis Date Noted   ADD (attention deficit disorder)    ACNE VULGARIS 03/09/2007    Allergies:  Allergies  Allergen Reactions   Percocet [Oxycodone-Acetaminophen] Nausea And Vomiting   Medications:  Current Outpatient Medications:    benzonatate (TESSALON) 100 MG capsule, Take 1 capsule (100 mg total) by mouth 2 (two) times daily as needed for cough., Disp: 20 capsule, Rfl: 0   molnupiravir EUA (LAGEVRIO) 200 mg CAPS capsule, Take 4 capsules (800 mg total) by mouth 2 (two) times daily for 5 days., Disp: 40 capsule, Rfl: 0   lisdexamfetamine (VYVANSE) 30 MG capsule,  Take 1 capsule (30 mg total) by mouth daily., Disp: 30 capsule, Rfl: 0   SUMAtriptan (IMITREX) 50 MG tablet, Take 1 tablet (50 mg total) by mouth daily as needed for migraine. May repeat in 2 hours if headache persists or recurs., Disp: 10 tablet, Rfl: 3   traZODone (DESYREL) 50 MG tablet, Take 0.5-1 tablets (25-50 mg total) by mouth at bedtime as needed for sleep., Disp: 30 tablet, Rfl: 3  Observations/Objective: Patient is well-developed, well-nourished in no acute distress.  Resting comfortably at home. Mildly fatigued appearing but nontoxic Head is normocephalic, atraumatic.   No labored breathing. Speaking in full sentences, and tolerating own secretions.   Speech is clear and coherent with logical content.  Patient is alert and oriented at baseline.   Assessment and Plan: 1. COVID-19 virus infection COVID test was positive You should remain isolated in your home for 5 days from symptom onset AND greater than 72 hours after symptoms resolution (absence of fever without the use of fever-reducing medication and improvement in respiratory symptoms), whichever is longer Get plenty of rest and push fluids Molnupiravir prescribed. Take as directed and to completion Tessalon perles prescribed for cough.   Use zyrtec for nasal congestion, runny nose, and/or sore throat Use flonase for nasal congestion and runny nose Use medications daily for symptom relief Use OTC medications like ibuprofen or tylenol as needed fever or pain Follow up with PCP in 1-2 days via phone or e-visit for recheck and to ensure symptoms are improving Call or go to the ED if you have any new or worsening symptoms such as fever, worsening cough, shortness of breath, chest tightness, chest pain, turning blue, changes in mental status, etc...    Follow Up Instructions: I discussed the assessment and treatment plan with the patient. The patient was provided an opportunity to ask questions and all were answered. The patient agreed with the plan and demonstrated an understanding of the instructions.  A copy of instructions were sent to the patient via MyChart unless otherwise noted below.    The patient was advised to call back or seek an in-person evaluation if the symptoms worsen or if the condition fails to improve as anticipated.  Time:  I spent 5-10 minutes with the patient via telehealth technology discussing the above problems/concerns.    Rennis Harding, PA-C

## 2021-04-28 NOTE — Patient Instructions (Signed)
Angelene Giovanni, thank you for joining Rennis Harding, PA-C for today's virtual visit.  While this provider is not your primary care provider (PCP), if your PCP is located in our provider database this encounter information will be shared with them immediately following your visit.  Consent: (Patient) Kurt Bishop provided verbal consent for this virtual visit at the beginning of the encounter.  Current Medications:  Current Outpatient Medications:    benzonatate (TESSALON) 100 MG capsule, Take 1 capsule (100 mg total) by mouth 2 (two) times daily as needed for cough., Disp: 20 capsule, Rfl: 0   molnupiravir EUA (LAGEVRIO) 200 mg CAPS capsule, Take 4 capsules (800 mg total) by mouth 2 (two) times daily for 5 days., Disp: 40 capsule, Rfl: 0   lisdexamfetamine (VYVANSE) 30 MG capsule, Take 1 capsule (30 mg total) by mouth daily., Disp: 30 capsule, Rfl: 0   SUMAtriptan (IMITREX) 50 MG tablet, Take 1 tablet (50 mg total) by mouth daily as needed for migraine. May repeat in 2 hours if headache persists or recurs., Disp: 10 tablet, Rfl: 3   traZODone (DESYREL) 50 MG tablet, Take 0.5-1 tablets (25-50 mg total) by mouth at bedtime as needed for sleep., Disp: 30 tablet, Rfl: 3   Medications ordered in this encounter:  Meds ordered this encounter  Medications   molnupiravir EUA (LAGEVRIO) 200 mg CAPS capsule    Sig: Take 4 capsules (800 mg total) by mouth 2 (two) times daily for 5 days.    Dispense:  40 capsule    Refill:  0    Order Specific Question:   Supervising Provider    Answer:   MILLER, BRIAN [[id number]]   benzonatate (TESSALON) 100 MG capsule    Sig: Take 1 capsule (100 mg total) by mouth 2 (two) times daily as needed for cough.    Dispense:  20 capsule    Refill:  0    Order Specific Question:   Supervising Provider    Answer:   Hyacinth Meeker, BRIAN [[id number]]     *If you need refills on other medications prior to your next appointment, please contact your  pharmacy*  Follow-Up: Call back or seek an in-person evaluation if the symptoms worsen or if the condition fails to improve as anticipated.  Other Instructions COVID test was positive You should remain isolated in your home for 5 days from symptom onset AND greater than 72 hours after symptoms resolution (absence of fever without the use of fever-reducing medication and improvement in respiratory symptoms), whichever is longer Get plenty of rest and push fluids Molnupiravir prescribed. Take as directed and to completion Tessalon perles prescribed for cough.   Use zyrtec for nasal congestion, runny nose, and/or sore throat Use flonase for nasal congestion and runny nose Use medications daily for symptom relief Use OTC medications like ibuprofen or tylenol as needed fever or pain Follow up with PCP in 1-2 days via phone or e-visit for recheck and to ensure symptoms are improving Call or go to the ED if you have any new or worsening symptoms such as fever, worsening cough, shortness of breath, chest tightness, chest pain, turning blue, changes in mental status, etc...     If you have been instructed to have an in-person evaluation today at a local Urgent Care facility, please use the link below. It will take you to a list of all of our available [facility], including address, phone number and hours of operation. Please do not delay care.  [facility]  If you or a family member do not have a primary care provider, use the link below to schedule a visit and establish care. When you choose a [place] primary care physician or advanced practice provider, you gain a long-term partner in health. Find a Primary Care Provider  Learn more about 's in-office and virtual care options: [facility]

## 2021-05-22 ENCOUNTER — Encounter: Payer: Self-pay | Admitting: Family Medicine

## 2021-05-23 MED ORDER — TRAZODONE HCL 50 MG PO TABS: 25.0000 mg | ORAL_TABLET | Freq: Every evening | ORAL | 3 refills | Status: DC | PRN

## 2021-05-24 ENCOUNTER — Other Ambulatory Visit: Payer: Self-pay | Admitting: Family Medicine

## 2021-05-24 MED ORDER — LISDEXAMFETAMINE DIMESYLATE 50 MG PO CAPS: 50.0000 mg | ORAL_CAPSULE | Freq: Every day | ORAL | 0 refills | Status: DC

## 2021-07-01 ENCOUNTER — Other Ambulatory Visit: Payer: Self-pay | Admitting: Family Medicine

## 2021-07-01 MED ORDER — LISDEXAMFETAMINE DIMESYLATE 50 MG PO CAPS: 50.0000 mg | ORAL_CAPSULE | Freq: Every day | ORAL | 0 refills | Status: DC

## 2021-07-08 ENCOUNTER — Encounter: Payer: Self-pay | Admitting: Family Medicine

## 2021-08-13 ENCOUNTER — Other Ambulatory Visit: Payer: Self-pay | Admitting: Family Medicine

## 2021-08-14 ENCOUNTER — Other Ambulatory Visit: Payer: Self-pay

## 2021-08-14 NOTE — Telephone Encounter (Signed)
LOV 02/22/21 (VIDEO) ?Last refill 07/01/21, #30, 0 refills ? ?Please review, thanks! ? ?

## 2021-08-15 MED ORDER — LISDEXAMFETAMINE DIMESYLATE 50 MG PO CAPS: 50.0000 mg | ORAL_CAPSULE | Freq: Every day | ORAL | 0 refills | Status: DC

## 2021-08-20 ENCOUNTER — Ambulatory Visit: Payer: Managed Care, Other (non HMO) | Admitting: Family Medicine

## 2021-08-22 ENCOUNTER — Telehealth: Payer: Self-pay

## 2021-08-22 ENCOUNTER — Other Ambulatory Visit: Payer: Self-pay | Admitting: Family Medicine

## 2021-08-22 MED ORDER — LISDEXAMFETAMINE DIMESYLATE 50 MG PO CAPS: 50.0000 mg | ORAL_CAPSULE | Freq: Every day | ORAL | 0 refills | Status: DC

## 2021-08-22 NOTE — Telephone Encounter (Signed)
Per pharmacy if you would approve 90 day refill vs. 30 day for pt due to insurance? ? ?If so, need new order.  ? ?Pls advise ?

## 2021-09-18 IMAGING — DX DG FINGER INDEX 2+V*R*
3 series · 3 of 3 positions shown · non-contrast
Comparison: No recent.

CLINICAL DATA: Right index finger pain.

EXAM:
RIGHT INDEX FINGER 2+V

[finger pa]
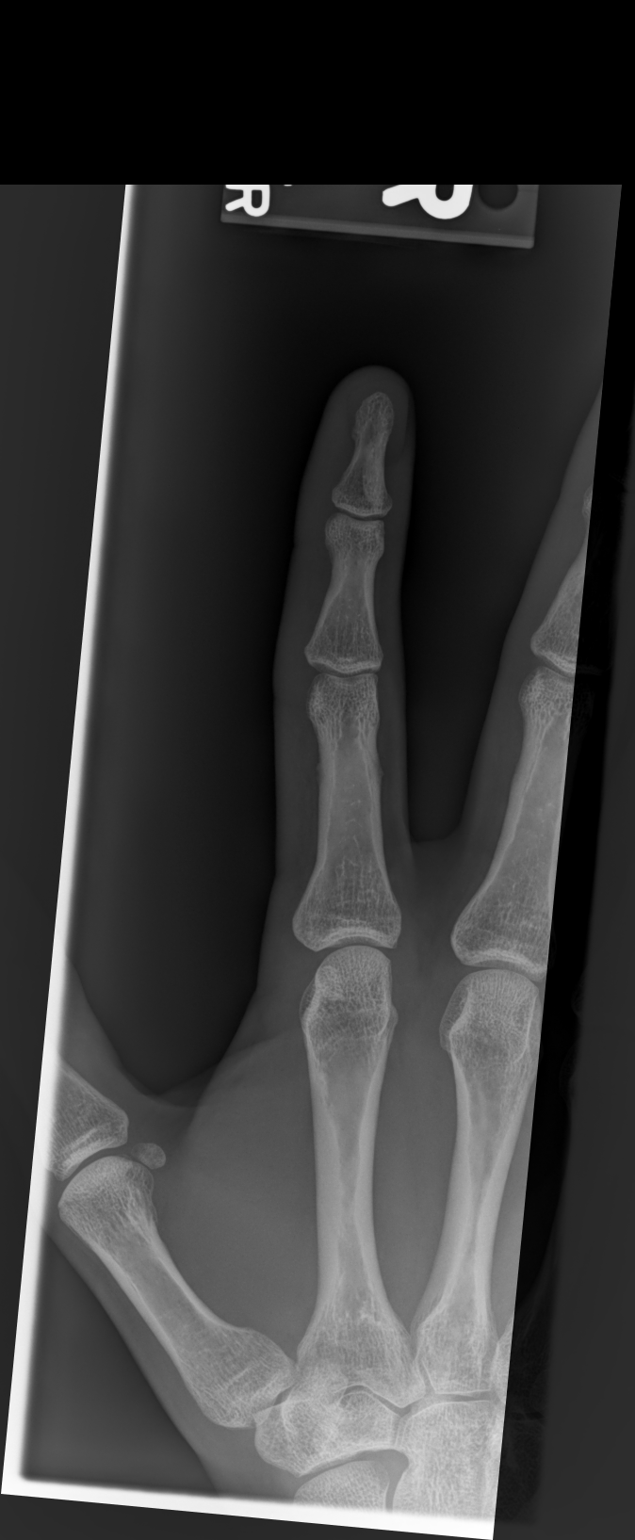

[finger mlo]
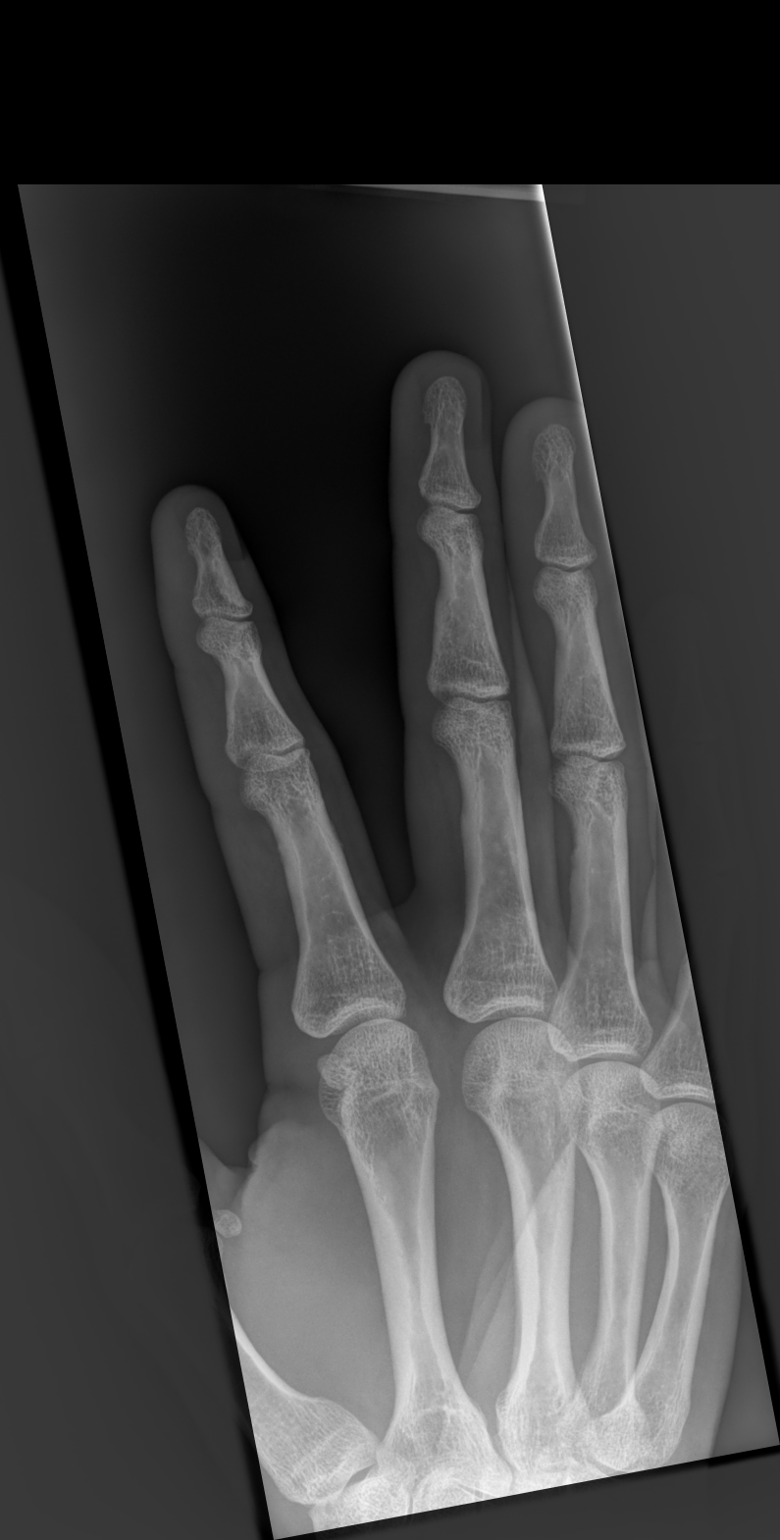

[2. finger lat]
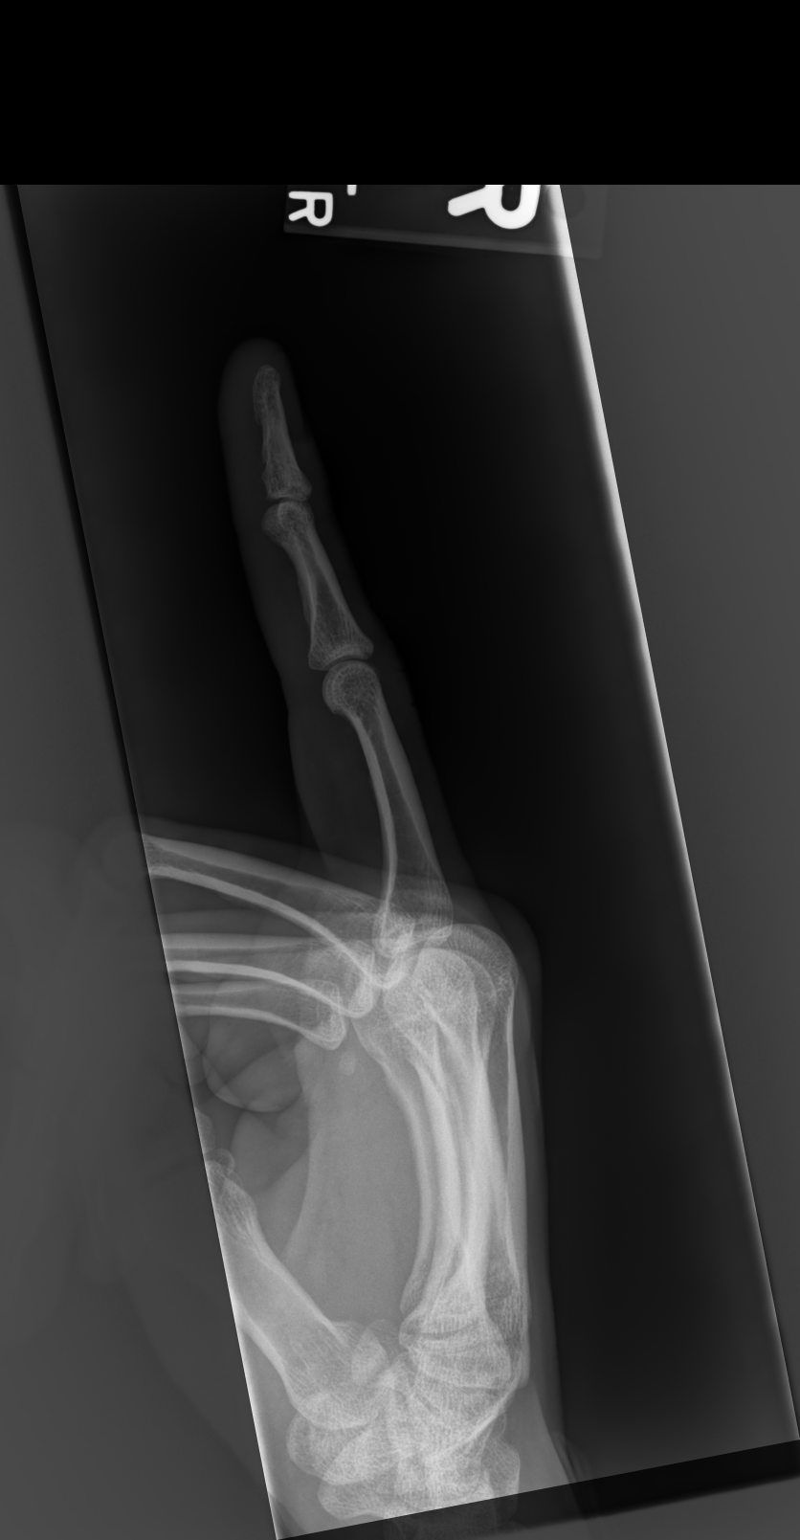

[3 of 3 positions shown; findings below may reference images not displayed]

FINDINGS: No acute bony or joint abnormality. Soft tissues are unremarkable.
No radiopaque foreign body.
IMPRESSION: No acute abnormality.

## 2021-09-20 ENCOUNTER — Other Ambulatory Visit: Payer: Self-pay | Admitting: Family Medicine

## 2021-09-20 ENCOUNTER — Encounter: Payer: Self-pay | Admitting: Family Medicine

## 2021-09-20 NOTE — Telephone Encounter (Signed)
Per chart rx sent 08/22/21 #90 - patient is not due for refill at this time.  ? ?

## 2021-12-13 ENCOUNTER — Other Ambulatory Visit: Payer: Self-pay | Admitting: Family Medicine

## 2021-12-14 ENCOUNTER — Other Ambulatory Visit: Payer: Self-pay | Admitting: Family Medicine

## 2021-12-16 MED ORDER — LISDEXAMFETAMINE DIMESYLATE 50 MG PO CAPS: 50.0000 mg | ORAL_CAPSULE | Freq: Every day | ORAL | 0 refills | Status: DC

## 2021-12-16 NOTE — Telephone Encounter (Signed)
Requested medications are due for refill today.  yes  Requested medications are on the active medications list.  yes  Last refill. Vyvanse 08/22/2021 #90 0 refills, Trazodone 05/23/2021 #30 3 refills  Future visit scheduled.   no  Notes to clinic.  Vyvanse is not delegated. PT is more than 3 months overdue for an OV.    Requested Prescriptions  Pending Prescriptions Disp Refills   VYVANSE 50 MG capsule [Pharmacy Med Name: VYVANSE 50 MG CAP] 90 capsule     Sig: TAKE ONE CAPSULE (50 MG TOTAL) BY MOUTH DAILY.     Not Delegated - Psychiatry:  Stimulants/ADHD Failed - 12/13/2021 10:15 AM      Failed - This refill cannot be delegated      Failed - Urine Drug Screen completed in last 360 days      Failed - Valid encounter within last 6 months    Recent Outpatient Visits           9 months ago Attention deficit hyperactivity disorder (ADHD), predominantly inattentive type   [facility] Pickard, Priscille Heidelberg, MD   2 years ago Shakiness   [facility] Donita Brooks, MD   4 years ago Chronic migraine without aura with status migrainosus, not intractable   [facility] [place], Priscille Heidelberg, MD   5 years ago Attention deficit hyperactivity disorder (ADHD), combined type   [facility] [place], Priscille Heidelberg, MD   5 years ago ADD (attention deficit disorder)   [facility] Pickard, Priscille Heidelberg, MD              Passed - Last BP in normal range    BP Readings from Last 1 Encounters:  12/04/20 122/86         Passed - Last Heart Rate in normal range    Pulse Readings from Last 1 Encounters:  12/04/20 (!) 101          traZODone (DESYREL) 50 MG tablet [Pharmacy Med Name: TRAZODONE HCL 50 MG TAB] 30 tablet 3    Sig: TAKE ONE-HALF TO ONE TABLET (25 TO 50MG  TOTAL) BY MOUTH AT BEDTIME AS NEEDED FORSLEEP     Psychiatry: Antidepressants - Serotonin Modulator Failed - 12/13/2021 10:15 AM      Failed - Valid encounter  within last 6 months    Recent Outpatient Visits           9 months ago Attention deficit hyperactivity disorder (ADHD), predominantly inattentive type   [facility] Pickard, [facility], MD   2 years ago Shakiness   [facility], MD   4 years ago Chronic migraine without aura with status migrainosus, not intractable   [facility] Pickard, [facility], MD   5 years ago Attention deficit hyperactivity disorder (ADHD), combined type   [facility] Pickard, [facility], MD   5 years ago ADD (attention deficit disorder)   [facility] [place], [facility], MD

## 2021-12-16 NOTE — Telephone Encounter (Signed)
Called pt - [facility] for appt.

## 2021-12-23 ENCOUNTER — Encounter: Payer: Self-pay | Admitting: Family Medicine

## 2021-12-23 ENCOUNTER — Other Ambulatory Visit: Payer: Self-pay

## 2021-12-23 MED ORDER — LISDEXAMFETAMINE DIMESYLATE 50 MG PO CAPS: 50.0000 mg | ORAL_CAPSULE | Freq: Every day | ORAL | 0 refills | Status: DC

## 2021-12-23 NOTE — Telephone Encounter (Signed)
Pt called in to get a courtesy refill of this med lisdexamfetamine (VYVANSE) 50 MG capsule [[id number]] to last until his scheduled appt with pcp on 8/15. Pt is aware that he needs to be seen by pcp for future refills.   LOV: MYCHART VIDEO VISIT 02/22/21

## 2021-12-24 NOTE — Telephone Encounter (Signed)
Please resend Rx per pt request

## 2021-12-26 ENCOUNTER — Other Ambulatory Visit: Payer: Self-pay | Admitting: Family Medicine

## 2021-12-26 ENCOUNTER — Telehealth: Payer: Self-pay

## 2021-12-26 MED ORDER — LISDEXAMFETAMINE DIMESYLATE 50 MG PO CAPS: 50.0000 mg | ORAL_CAPSULE | Freq: Every day | ORAL | 0 refills | Status: DC

## 2021-12-26 NOTE — Telephone Encounter (Signed)
Pt's wife called in stating that this med lisdexamfetamine Arlyce Harman) was sent to the wrong pharmacy [facility]). Pt would like for this refill to be resent to a [facility] in [place]. Pt states that he was not able to pick his meds up from [facility] pharmacy.   Cb#: [phone]/ wife Apolonio Schneiders [phone]

## 2021-12-31 ENCOUNTER — Ambulatory Visit: Payer: Managed Care, Other (non HMO) | Admitting: Family Medicine

## 2021-12-31 VITALS — BP 112/72 | HR 81 | Ht 70.5 in | Wt 135.0 lb

## 2021-12-31 DIAGNOSIS — Z23 Encounter for immunization: Secondary | ICD-10-CM | POA: Diagnosis not present

## 2021-12-31 DIAGNOSIS — F900 Attention-deficit hyperactivity disorder, predominantly inattentive type: Secondary | ICD-10-CM | POA: Diagnosis not present

## 2021-12-31 DIAGNOSIS — Z13220 Encounter for screening for lipoid disorders: Secondary | ICD-10-CM | POA: Diagnosis not present

## 2021-12-31 NOTE — Progress Notes (Signed)
Subjective:    Patient ID: Kurt Bishop, male    DOB: 09/10/1985, 36 y.o.   MRN: [id number] Patient is a very pleasant 36 year old Caucasian gentleman with a history of ADD.  He is currently on Vyvanse.  He states that the medication works well for him.  Without the medication he is easily distracted.  He starts numerous task but does not complete them.  He states that he has trouble listening and following directions.  With the medication he is able to focus.  He is able to complete assigned tasks in a timely fashion.  It helps him at work.  He denies any chest pain or shortness of breath or palpitations.  He denies any trouble with appetite.  He denies any insomnia.  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  He has been there for the last 3 years and is doing well in his job.  He denies any concerns.  Specifically denies any anxiety or depression.  He denies any abdominal pain or nausea or vomiting. Past Medical History:  Diagnosis Date   ADD (attention deficit disorder)    Complication of anesthesia    woke up slow & also reports that he passed out 3 times after getting home post hernia repair     Family history of adverse reaction to anesthesia    passing out & nausea & vomiting - Mother    Headache    uses excedrin 4-5 times per yr.    Seizure-like activity (HCC)    associated with the vasovagal response, seeing blood, smashing thumb at work with a hammer     Syncope, vasovagal 09/2014   "passed out post op & again at work & taken to [facility] in [place]"   Current Outpatient Medications on File Prior to Visit  Medication Sig Dispense Refill   lisdexamfetamine (VYVANSE) 50 MG capsule Take 1 capsule (50 mg total) by mouth daily. 90 capsule 0   SUMAtriptan (IMITREX) 50 MG tablet Take 1 tablet (50 mg total) by mouth daily as needed for migraine. May repeat in 2 hours if headache persists or recurs. 10 tablet 3   traZODone (DESYREL) 50 MG tablet  Take 0.5-1 tablets (25-50 mg total) by mouth at bedtime as needed for sleep. 30 tablet 3   No current facility-administered medications on file prior to visit.   Allergies  Allergen Reactions   Percocet [Oxycodone-Acetaminophen] Nausea And Vomiting   Social History   Socioeconomic History   Marital status: Married    Spouse name: Not on file   Number of children: Not on file   Years of education: Not on file   Highest education level: Not on file  Occupational History   Not on file  Tobacco Use   Smoking status: Former    Types: E-cigarettes    Quit date: 06/19/2012    Years since quitting: 9.5   Smokeless tobacco: Former    Quit date: 06/18/2012  Vaping Use   Vaping Use: Never used  Substance and Sexual Activity   Alcohol use: Yes    Comment: beer every now and then   Drug use: No   Sexual activity: Not on file  Other Topics Concern   Not on file  Social History Narrative   Not on file   Social Determinants of Health   Financial Resource Strain: Not on file  Food Insecurity: Not on file  Transportation Needs: Not on file  Physical Activity:  Not on file  Stress: Not on file  Social Connections: Not on file  Intimate Partner Violence: Not on file     Review of Systems  All other systems reviewed and are negative.      Objective:   Physical Exam Vitals reviewed.  Constitutional:      Appearance: He is well-developed.  HENT:     Mouth/Throat:     Dentition: Abnormal dentition. Dental caries present.  Neck:     Vascular: No JVD.  Cardiovascular:     Rate and Rhythm: Normal rate and regular rhythm.     Heart sounds: Normal heart sounds. No murmur heard. Pulmonary:     Effort: Pulmonary effort is normal. No respiratory distress.     Breath sounds: Normal breath sounds. No wheezing or rales.  Abdominal:     General: Bowel sounds are normal. There is no distension.     Palpations: Abdomen is soft.     Tenderness: There is no abdominal tenderness. There is  no guarding or rebound.  Musculoskeletal:     Cervical back: Neck supple.  Lymphadenopathy:     Cervical: No cervical adenopathy.           Assessment & Plan:  Screening cholesterol level - Plan: CBC with Differential/Platelet, COMPLETE METABOLIC PANEL WITH GFR, LDL Cholesterol, Direct  Encounter for vaccination - Plan: Tdap vaccine greater than or equal to 7yo IM  Attention deficit hyperactivity disorder (ADHD), predominantly inattentive type  Patient is.  His blood pressure is excellent.  He is due for a tetanus shot and specifically doing the work he is doing in a landfill I think that this is wise for him to get this today so he received it.  His hepatitis vaccination series is up-to-date.  We will check a CBC, CMP, and LDL cholesterol.  Patient's dose of Vyvanse is working well.  We will make no changes.  He will continue Vyvanse 60 mg a day.  He is no longer taking trazodone.  He is able to sleep on his own.  He seldom needs Imitrex.  He will only very occasionally take trazodone.

## 2022-01-01 ENCOUNTER — Encounter: Payer: Self-pay | Admitting: Family Medicine

## 2022-01-01 LAB — COMPLETE METABOLIC PANEL WITH GFR
AG Ratio: 1.9 (calc) (ref 1.0–2.5)
ALT: 10 U/L (ref 9–46)
AST: 12 U/L (ref 10–40)
Albumin: 4.5 g/dL (ref 3.6–5.1)
Alkaline phosphatase (APISO): 40 U/L (ref 36–130)
BUN: 15 mg/dL (ref 7–25)
CO2: 28 mmol/L (ref 20–32)
Calcium: 9.7 mg/dL (ref 8.6–10.3)
Chloride: 100 mmol/L (ref 98–110)
Creat: 1.24 mg/dL (ref 0.60–1.26)
Globulin: 2.4 g/dL (calc) (ref 1.9–3.7)
Glucose, Bld: 87 mg/dL (ref 65–99)
Potassium: 4.4 mmol/L (ref 3.5–5.3)
Sodium: 138 mmol/L (ref 135–146)
Total Bilirubin: 1.6 mg/dL — ABNORMAL HIGH (ref 0.2–1.2)
Total Protein: 6.9 g/dL (ref 6.1–8.1)
eGFR: 79 mL/min/{1.73_m2} (ref >=60)

## 2022-01-01 LAB — LDL CHOLESTEROL, DIRECT: Direct LDL: 83 mg/dL (ref <100)

## 2022-01-01 LAB — CBC WITH DIFFERENTIAL/PLATELET
Absolute Monocytes: 460 cells/uL (ref 200–950)
Basophils Absolute: 50 cells/uL (ref 0–200)
Basophils Relative: 0.8 %
Eosinophils Absolute: 384 cells/uL (ref 15–500)
Eosinophils Relative: 6.1 %
HCT: 48 % (ref 38.5–50.0)
Hemoglobin: 16.3 g/dL (ref 13.2–17.1)
Lymphs Abs: 1695 cells/uL (ref 850–3900)
MCH: 29 pg (ref 27.0–33.0)
MCHC: 34 g/dL (ref 32.0–36.0)
MCV: 85.3 fL (ref 80.0–100.0)
MPV: 10 fL (ref 7.5–12.5)
Monocytes Relative: 7.3 %
Neutro Abs: 3711 cells/uL (ref 1500–7800)
Neutrophils Relative %: 58.9 %
Platelets: 247 10*3/uL (ref 140–400)
RBC: 5.63 10*6/uL (ref 4.20–5.80)
RDW: 12.5 % (ref 11.0–15.0)
Total Lymphocyte: 26.9 %
WBC: 6.3 10*3/uL (ref 3.8–10.8)

## 2022-01-02 ENCOUNTER — Other Ambulatory Visit: Payer: Self-pay | Admitting: Family Medicine

## 2022-01-02 MED ORDER — LISDEXAMFETAMINE DIMESYLATE 50 MG PO CAPS: 50.0000 mg | ORAL_CAPSULE | Freq: Every day | ORAL | 0 refills | Status: DC

## 2022-04-03 ENCOUNTER — Other Ambulatory Visit: Payer: Self-pay | Admitting: Family Medicine

## 2022-04-03 MED ORDER — LISDEXAMFETAMINE DIMESYLATE 50 MG PO CAPS: 50.0000 mg | ORAL_CAPSULE | Freq: Every day | ORAL | 0 refills | Status: DC

## 2022-07-03 ENCOUNTER — Other Ambulatory Visit: Payer: Self-pay | Admitting: Family Medicine

## 2022-07-04 MED ORDER — LISDEXAMFETAMINE DIMESYLATE 50 MG PO CAPS: 50.0000 mg | ORAL_CAPSULE | Freq: Every day | ORAL | 0 refills | Status: DC

## 2022-10-10 ENCOUNTER — Ambulatory Visit: Payer: Managed Care, Other (non HMO) | Admitting: Family Medicine

## 2022-10-10 ENCOUNTER — Encounter: Payer: Self-pay | Admitting: Family Medicine

## 2022-10-10 VITALS — BP 126/82 | HR 89 | Temp 98.2°F | Ht 70.5 in | Wt 138.0 lb

## 2022-10-10 DIAGNOSIS — R42 Dizziness and giddiness: Secondary | ICD-10-CM | POA: Diagnosis not present

## 2022-10-10 NOTE — Progress Notes (Signed)
Subjective:    Patient ID: Kurt Bishop, male    DOB: Sep 30, 1985, 37 y.o.   MRN: [id number]  Patient is a 37 year old gentleman who presents today with vertigo.  He states that the vertigo suddenly hit him Wednesday.  He was walking his dog when suddenly the environment around him began spinning.  It lasted for a few minutes and then gradually subsided.  He denies any tinnitus or hearing loss.  However he states that the vertigo is happening every time he performs a Valsalva maneuver.  If he coughs or sneezes he will have vertigo.  If he takes a long inhalation/drag off his vape it will trigger vertigo.  Today Dix-Hallpike maneuver is negative bilaterally.  He denies any vertigo with head movement.  He denies any otalgia.  He has some mild chronic hearing loss.  He denies any tinnitus.  He denies any migraine or headache associated with the vertigo.  He does have a history of migraines but has not had one in quite some time.  He denies any head trauma.  He denies any fevers or chills Past Medical History:  Diagnosis Date   ADD (attention deficit disorder)    Complication of anesthesia    woke up slow & also reports that he passed out 3 times after getting home post hernia repair     Family history of adverse reaction to anesthesia    passing out & nausea & vomiting - Mother    Headache    uses excedrin 4-5 times per yr.    Seizure-like activity (HCC)    associated with the vasovagal response, seeing blood, smashing thumb at work with a hammer     Syncope, vasovagal 09/2014   "passed out post op & again at work & taken to [facility] in [place]"   Current Outpatient Medications on File Prior to Visit  Medication Sig Dispense Refill   lisdexamfetamine (VYVANSE) 50 MG capsule Take 1 capsule (50 mg total) by mouth daily. 90 capsule 0   SUMAtriptan (IMITREX) 50 MG tablet Take 1 tablet (50 mg total) by mouth daily as needed for migraine. May repeat in 2 hours if headache persists or recurs. 10  tablet 3   traZODone (DESYREL) 50 MG tablet Take 0.5-1 tablets (25-50 mg total) by mouth at bedtime as needed for sleep. 30 tablet 3   No current facility-administered medications on file prior to visit.   Allergies  Allergen Reactions   Percocet [Oxycodone-Acetaminophen] Nausea And Vomiting   Social History   Socioeconomic History   Marital status: Married    Spouse name: Not on file   Number of children: Not on file   Years of education: Not on file   Highest education level: Not on file  Occupational History   Not on file  Tobacco Use   Smoking status: Former    Types: E-cigarettes    Quit date: 06/19/2012    Years since quitting: 10.3   Smokeless tobacco: Former    Quit date: 06/18/2012  Vaping Use   Vaping Use: Never used  Substance and Sexual Activity   Alcohol use: Yes    Comment: beer every now and then   Drug use: No   Sexual activity: Not on file  Other Topics Concern   Not on file  Social History Narrative   Not on file   Social Determinants of Health   Financial Resource Strain: Not on file  Food Insecurity: Not on file  Transportation Needs: Not on file  Physical Activity: Not on file  Stress: Not on file  Social Connections: Not on file  Intimate Partner Violence: Not on file     Review of Systems  All other systems reviewed and are negative.      Objective:   Physical Exam Vitals reviewed.  Constitutional:      General: He is not in acute distress.    Appearance: Normal appearance. He is well-developed and normal weight. He is not ill-appearing, toxic-appearing or diaphoretic.  HENT:     Right Ear: Tympanic membrane and ear canal normal.     Left Ear: Tympanic membrane and ear canal normal.     Nose: Nose normal. No congestion or rhinorrhea.     Mouth/Throat:     Dentition: Abnormal dentition. Dental caries present.  Eyes:     Extraocular Movements: Extraocular movements intact.     Pupils: Pupils are equal, round, and reactive to  light.  Neck:     Vascular: No JVD.  Cardiovascular:     Rate and Rhythm: Normal rate and regular rhythm.     Heart sounds: Normal heart sounds. No murmur heard. Pulmonary:     Effort: Pulmonary effort is normal. No respiratory distress.     Breath sounds: Normal breath sounds. No wheezing or rales.  Abdominal:     General: Bowel sounds are normal. There is no distension.     Palpations: Abdomen is soft.     Tenderness: There is no abdominal tenderness. There is no guarding or rebound.  Musculoskeletal:     Cervical back: Neck supple.  Lymphadenopathy:     Cervical: No cervical adenopathy.  Neurological:     General: No focal deficit present.     Mental Status: He is alert and oriented to person, place, and time. Mental status is at baseline.     Cranial Nerves: No cranial nerve deficit.     Motor: No weakness.     Coordination: Coordination normal.     Gait: Gait normal.           Assessment & Plan:  Vertigo Patient is certainly having vertigo.  However his symptoms are inconsistent with BPPV.  Instead the vertigo seems to be triggered by Valsalva maneuver such as sneezing or coughing or deep inspiration.  Motion does not seem to trigger the vertigo.  Therefore this seems to be an issue with pressure in the semicircular canal.  Differential diagnosis includes Mnire's disease, perilymphatic fistula, etc. Patient states the symptoms are getting better.  Therefore at the present time no treatment is necessary.  If the vertigo associated with Valsalva continues to persist I will try the patient on hydrochlorothiazide.

## 2022-10-28 ENCOUNTER — Other Ambulatory Visit: Payer: Self-pay | Admitting: Family Medicine

## 2022-10-30 MED ORDER — LISDEXAMFETAMINE DIMESYLATE 50 MG PO CAPS: 50.0000 mg | ORAL_CAPSULE | Freq: Every day | ORAL | 0 refills | Status: DC

## 2023-02-03 ENCOUNTER — Other Ambulatory Visit: Payer: Self-pay | Admitting: Family Medicine

## 2023-02-05 ENCOUNTER — Other Ambulatory Visit: Payer: Self-pay | Admitting: Family Medicine

## 2023-02-06 ENCOUNTER — Encounter: Payer: Self-pay | Admitting: Family Medicine

## 2023-02-06 ENCOUNTER — Other Ambulatory Visit: Payer: Self-pay | Admitting: Family Medicine

## 2023-02-06 MED ORDER — LISDEXAMFETAMINE DIMESYLATE 50 MG PO CAPS: 50.0000 mg | ORAL_CAPSULE | Freq: Every day | ORAL | 0 refills | Status: DC

## 2023-02-19 MED ORDER — LISDEXAMFETAMINE DIMESYLATE 50 MG PO CAPS: 50.0000 mg | ORAL_CAPSULE | Freq: Every day | ORAL | 0 refills | Status: DC

## 2023-02-24 MED ORDER — LISDEXAMFETAMINE DIMESYLATE 50 MG PO CAPS: 50.0000 mg | ORAL_CAPSULE | Freq: Every day | ORAL | 0 refills | Status: DC

## 2023-05-01 ENCOUNTER — Other Ambulatory Visit: Payer: Self-pay | Admitting: Family Medicine

## 2023-05-01 ENCOUNTER — Encounter: Payer: Self-pay | Admitting: Family Medicine

## 2023-05-01 MED ORDER — LISDEXAMFETAMINE DIMESYLATE 50 MG PO CAPS: 50.0000 mg | ORAL_CAPSULE | Freq: Every day | ORAL | 0 refills | Status: DC

## 2023-08-03 ENCOUNTER — Encounter: Payer: Self-pay | Admitting: Family Medicine

## 2023-08-03 ENCOUNTER — Other Ambulatory Visit: Payer: Self-pay | Admitting: Family Medicine

## 2023-08-04 ENCOUNTER — Other Ambulatory Visit: Payer: Self-pay | Admitting: Family Medicine

## 2023-08-04 MED ORDER — LISDEXAMFETAMINE DIMESYLATE 50 MG PO CAPS: 50.0000 mg | ORAL_CAPSULE | Freq: Every day | ORAL | 0 refills | Status: DC

## 2023-08-04 NOTE — Telephone Encounter (Signed)
 Copied from CRM [id number]. Topic: Clinical - Medication Refill >> Aug 04, 2023  9:04 AM Dimitri Ped wrote: Most Recent Primary Care Visit:  Provider: Lynnea Ferrier T  Department: [facility]  Visit Type: OFFICE VISIT  Date: 10/10/2022  Medication: lisdexamfetamine (VYVANSE) 50 MG capsule   Has the patient contacted their pharmacy? Yes have to contact doctor or needed a appointment before refilling (Agent: If no, request that the patient contact the pharmacy for the refill. If patient does not wish to contact the pharmacy document the reason why and proceed with request.) (Agent: If yes, when and what did the pharmacy advise?)  Is this the correct pharmacy for this prescription? Yes If no, delete pharmacy and type the correct one.  This is the patient's preferred pharmacy:  [facility] - [place], [place] - [address] [place] [address] Phone: [phone] Fax: [phone]  [id number] [place], [place] - [address] [place] [address] Phone: [phone] Fax: [phone]   Has the prescription been filled recently? No  Is the patient out of the medication? No 3 left  Has the patient been seen for an appointment in the last year OR does the patient have an upcoming appointment? Yes  Can we respond through MyChart? Yes  Agent: Please be advised that Rx refills may take up to 3 business days. We ask that you follow-up with your pharmacy.

## 2023-08-05 NOTE — Telephone Encounter (Signed)
 Requested medication (s) are due for refill today: yes  Requested medication (s) are on the active medication list: yes  Last refill:  08/04/23  Future visit scheduled: yes  Notes to clinic:  unable to refuse, too soon for refill. Routing for refusal     Requested Prescriptions  Pending Prescriptions Disp Refills   lisdexamfetamine (VYVANSE) 50 MG capsule 90 capsule 0    Sig: Take 1 capsule (50 mg total) by mouth daily.     Not Delegated - Psychiatry:  Stimulants/ADHD Failed - 08/05/2023  1:20 PM      Failed - This refill cannot be delegated      Failed - Urine Drug Screen completed in last 360 days      Failed - Valid encounter within last 6 months    Recent Outpatient Visits           2 years ago Attention deficit hyperactivity disorder (ADHD), predominantly inattentive type   [facility] [place], Priscille Heidelberg, MD   3 years ago Shakiness   [facility] [place], Priscille Heidelberg, MD   6 years ago Chronic migraine without aura with status migrainosus, not intractable   [facility] [place], Priscille Heidelberg, MD   7 years ago Attention deficit hyperactivity disorder (ADHD), combined type   [facility] [place], Priscille Heidelberg, MD   7 years ago ADD (attention deficit disorder)   [facility] [place], Priscille Heidelberg, MD              Passed - Last BP in normal range    BP Readings from Last 1 Encounters:  10/10/22 126/82         Passed - Last Heart Rate in normal range    Pulse Readings from Last 1 Encounters:  10/10/22 89

## 2023-08-06 ENCOUNTER — Other Ambulatory Visit: Payer: Self-pay | Admitting: Family Medicine

## 2023-08-06 MED ORDER — LISDEXAMFETAMINE DIMESYLATE 50 MG PO CAPS: 50.0000 mg | ORAL_CAPSULE | Freq: Every day | ORAL | 0 refills | Status: DC

## 2023-08-06 NOTE — Telephone Encounter (Unsigned)
 Copied from CRM [id number]. Topic: Clinical - Medication Refill >> Aug 06, 2023  4:49 PM Turkey B wrote: Most Recent Primary Care Visit:  Provider: Lynnea Ferrier T  Department: [facility]  Visit Type: OFFICE VISIT  Date: 10/10/2022  Medication: lisdexamfetamine (VYVANSE) 50 MG capsule  Has the patient contacted their pharmacy? yes (Agent: If yes, when and what did the pharmacy advise?)contact pcp/pt's normal pharm, doesn't have vyvanse, its on backorder, [facility]  Is this the correct pharmacy for this prescription? yes   This is the patient's preferred pharmacy:   [phone] [place], [place] - [address] [place] [address] Phone: [phone] Fax: [phone]   Has the prescription been filled recently? no  Is the patient out of the medication? yes  Has the patient been seen for an appointment in the last year OR does the patient have an upcoming appointment? yes  Can we respond through MyChart? yes  Agent: Please be advised that Rx refills may take up to 3 business days. We ask that you follow-up with your pharmacy.

## 2023-08-07 MED ORDER — LISDEXAMFETAMINE DIMESYLATE 50 MG PO CAPS: 50.0000 mg | ORAL_CAPSULE | Freq: Every day | ORAL | 0 refills | Status: AC

## 2023-08-07 NOTE — Telephone Encounter (Signed)
 Requested medication (s) are due for refill today: see below  Requested medication (s) are on the active medication list: yes  Last refill:  08/06/23 #90/0  Future visit scheduled: no  Notes to clinic:  not delegated, [facility], [facility] 2      Requested Prescriptions  Pending Prescriptions Disp Refills   lisdexamfetamine (VYVANSE) 50 MG capsule 90 capsule 0    Sig: Take 1 capsule (50 mg total) by mouth daily.     Not Delegated - Psychiatry:  Stimulants/ADHD Failed - 08/07/2023  2:56 PM      Failed - This refill cannot be delegated      Failed - Urine Drug Screen completed in last 360 days      Failed - Valid encounter within last 6 months    Recent Outpatient Visits           2 years ago Attention deficit hyperactivity disorder (ADHD), predominantly inattentive type   [facility] [place], Priscille Heidelberg, MD   4 years ago Shakiness   [facility] [place], Priscille Heidelberg, MD   6 years ago Chronic migraine without aura with status migrainosus, not intractable   [facility] [place], Priscille Heidelberg, MD   7 years ago Attention deficit hyperactivity disorder (ADHD), combined type   [facility] [place], Priscille Heidelberg, MD   7 years ago ADD (attention deficit disorder)   [facility] [place], Priscille Heidelberg, MD              Passed - Last BP in normal range    BP Readings from Last 1 Encounters:  10/10/22 126/82         Passed - Last Heart Rate in normal range    Pulse Readings from Last 1 Encounters:  10/10/22 89

## 2023-08-09 ENCOUNTER — Encounter: Payer: Self-pay | Admitting: Family Medicine

## 2023-08-10 ENCOUNTER — Telehealth: Payer: Self-pay

## 2023-08-10 ENCOUNTER — Encounter: Payer: Self-pay | Admitting: Family Medicine

## 2023-08-10 ENCOUNTER — Other Ambulatory Visit: Payer: Self-pay | Admitting: Family Medicine

## 2023-08-10 MED ORDER — LISDEXAMFETAMINE DIMESYLATE 50 MG PO CAPS: 50.0000 mg | ORAL_CAPSULE | Freq: Every day | ORAL | 0 refills | Status: AC

## 2023-08-10 NOTE — Telephone Encounter (Signed)
 Pt's wife called and asked if the Rx [facility] [address]

## 2023-08-11 ENCOUNTER — Telehealth: Payer: Self-pay

## 2023-08-11 ENCOUNTER — Other Ambulatory Visit: Payer: Self-pay | Admitting: Family Medicine

## 2023-08-11 MED ORDER — LISDEXAMFETAMINE DIMESYLATE 50 MG PO CAPS: 50.0000 mg | ORAL_CAPSULE | Freq: Every day | ORAL | 0 refills | Status: DC

## 2023-08-11 NOTE — Telephone Encounter (Signed)
 Copied from CRM [id number]. Topic: Clinical - Prescription Issue >> Aug 11, 2023  2:41 PM Priscille Loveless wrote: Reason for CRM: I show this rx was sent over yesterday at 508 but they are saying that they did not receive it. Could you please call and confirm or resend this rx, the patient is out of his medicine and is in desperate need of it.   See below transmission:    lisdexamfetamine (VYVANSE) 50 MG capsule [[id number]]   Order Details Dose: 50 mg Route: Oral Frequency: Daily Dispense Quantity: 90 capsule Refills: 0      Sig: Take 1 capsule (50 mg total) by mouth daily.     Start Date: 08/10/23 End Date: -- Written Date: 08/10/23 Expiration Date: 02/06/24 Earliest Fill Date: 08/10/23   Original Order: lisdexamfetamine (VYVANSE) 50 MG capsule [[id number]] Providers  Authorizing Provider: Donita Brooks, MD [address] [place], [place] [address] Phone: [phone]   Fax: [phone] DEA #: [id number]   NPI: [id number]    Ordering User: Donita Brooks, MD    Pharmacy  [phone] [place], [place] - [address], [place] [address] Phone: [phone]  Fax: [phone] DEA #: [id number] DAW Reason: --

## 2023-08-13 ENCOUNTER — Ambulatory Visit: Admitting: Family Medicine

## 2023-08-13 ENCOUNTER — Encounter: Payer: Self-pay | Admitting: Family Medicine

## 2023-08-13 VITALS — BP 120/70 | HR 84 | Temp 98.1°F | Ht 70.5 in | Wt 140.5 lb

## 2023-08-13 DIAGNOSIS — F900 Attention-deficit hyperactivity disorder, predominantly inattentive type: Secondary | ICD-10-CM | POA: Diagnosis not present

## 2023-08-13 DIAGNOSIS — Z0001 Encounter for general adult medical examination with abnormal findings: Secondary | ICD-10-CM | POA: Diagnosis not present

## 2023-08-13 DIAGNOSIS — Z13220 Encounter for screening for lipoid disorders: Secondary | ICD-10-CM

## 2023-08-13 DIAGNOSIS — Z0000 Encounter for general adult medical examination without abnormal findings: Secondary | ICD-10-CM

## 2023-08-13 NOTE — Progress Notes (Signed)
 Subjective:    Patient ID: Kurt Bishop, male    DOB: Apr 08, 1986, 38 y.o.   MRN: [id number] Patient is a very pleasant 38 year old Caucasian gentleman with a history of ADD.  He is currently on Vyvanse.  He states that the medication works well for him.  He denies any panic attacks on Vyvanse.  He denies any severe insomnia on Vyvanse.  He denies any abdominal pain or palpitations.  He is due for a physical exam.  We have not cholesterol or any lab work in almost 2 years.  His tetanus shot is up-to-date.  He is not yet due for any cancer screening. Past Medical History:  Diagnosis Date   ADD (attention deficit disorder)    Complication of anesthesia    woke up slow & also reports that he passed out 3 times after getting home post hernia repair     Family history of adverse reaction to anesthesia    passing out & nausea & vomiting - Mother    Headache    uses excedrin 4-5 times per yr.    Seizure-like activity (HCC)    associated with the vasovagal response, seeing blood, smashing thumb at work with a hammer     Syncope, vasovagal 09/2014   "passed out post op & again at work & taken to [facility] in [place]"   Current Outpatient Medications on File Prior to Visit  Medication Sig Dispense Refill   lisdexamfetamine (VYVANSE) 50 MG capsule Take 1 capsule (50 mg total) by mouth daily. 90 capsule 0   lisdexamfetamine (VYVANSE) 50 MG capsule Take 1 capsule (50 mg total) by mouth daily. 90 capsule 0   lisdexamfetamine (VYVANSE) 50 MG capsule Take 1 capsule (50 mg total) by mouth daily. 90 capsule 0   No current facility-administered medications on file prior to visit.   Allergies  Allergen Reactions   Percocet [Oxycodone-Acetaminophen] Nausea And Vomiting   Social History   Socioeconomic History   Marital status: Married    Spouse name: Not on file   Number of children: Not on file   Years of education: Not on file   Highest education level: Some college, no degree  Occupational  History   Not on file  Tobacco Use   Smoking status: Former    Types: E-cigarettes    Quit date: 06/19/2012    Years since quitting: 11.1   Smokeless tobacco: Former    Quit date: 06/18/2012  Vaping Use   Vaping status: Never Used  Substance and Sexual Activity   Alcohol use: Yes    Comment: beer every now and then   Drug use: No   Sexual activity: Not on file  Other Topics Concern   Not on file  Social History Narrative   Not on file   Social Drivers of Health   Financial Resource Strain: Low Risk  (08/09/2023)   Overall Financial Resource Strain (CARDIA)    Difficulty of Paying Living Expenses: Not hard at all  Food Insecurity: No Food Insecurity (08/09/2023)   Hunger Vital Sign    Worried About Running Out of Food in the Last Year: Never true    Ran Out of Food in the Last Year: Never true  Transportation Needs: No Transportation Needs (08/09/2023)   PRAPARE - Administrator, Civil Service (Medical): No    Lack of Transportation (Non-Medical): No  Physical Activity: Unknown (08/09/2023)   Exercise Vital Sign    Days of Exercise per Week: Patient declined  Minutes of Exercise per Session: Not on file  Stress: Stress Concern Present (08/09/2023)   [employer] of Occupational Health - Occupational Stress Questionnaire    Feeling of Stress : Rather much  Social Connections: Socially Isolated (08/09/2023)   Social Connection and Isolation Panel [NHANES]    Frequency of Communication with Friends and Family: Once a week    Frequency of Social Gatherings with Friends and Family: Once a week    Attends Religious Services: Never    Database administrator or Organizations: No    Attends Engineer, structural: Not on file    Marital Status: Married  Intimate Partner Violence: Unknown (08/19/2021)   Received from [employer], [facility]   HITS    Physically Hurt: Not on file    Insult or Talk Down To: Not on file    Threaten Physical Harm: Not on file     Scream or Curse: Not on file     Review of Systems  All other systems reviewed and are negative.      Objective:   Physical Exam Vitals reviewed.  Constitutional:      Appearance: He is well-developed.  HENT:     Mouth/Throat:     Dentition: Abnormal dentition. Dental caries present.  Neck:     Vascular: No JVD.  Cardiovascular:     Rate and Rhythm: Normal rate and regular rhythm.     Heart sounds: Normal heart sounds. No murmur heard. Pulmonary:     Effort: Pulmonary effort is normal. No respiratory distress.     Breath sounds: Normal breath sounds. No wheezing or rales.  Abdominal:     General: Bowel sounds are normal. There is no distension.     Palpations: Abdomen is soft.     Tenderness: There is no abdominal tenderness. There is no guarding or rebound.  Musculoskeletal:     Cervical back: Neck supple.  Lymphadenopathy:     Cervical: No cervical adenopathy.           Assessment & Plan:  Screening cholesterol level - Plan: CBC with Differential/Platelet, COMPLETE METABOLIC PANEL WITHOUT GFR, Lipid panel  Attention deficit hyperactivity disorder (ADHD), predominantly inattentive type  General medical exam We will continue Vyvanse 50 mg daily as this seems to help manage his ADD.  Without the medication he is unable to focus.  He finds himself starting task becoming distracted with failing to finish.  With the medication, he is able to focus and complete his assigned duties at work.  I will check a CBC a CMP and a lipid panel while the patient is here today.  The remainder of his physical exam is normal.

## 2023-08-14 LAB — LIPID PANEL
Cholesterol: 172 mg/dL (ref <200)
HDL: 80 mg/dL (ref >=40)
LDL Cholesterol (Calc): 71 mg/dL
Non-HDL Cholesterol (Calc): 92 mg/dL (ref <130)
Total CHOL/HDL Ratio: 2.2 (calc) (ref <5.0)
Triglycerides: 125 mg/dL (ref <150)

## 2023-08-14 LAB — COMPLETE METABOLIC PANEL WITHOUT GFR
AG Ratio: 2 (calc) (ref 1.0–2.5)
ALT: 11 U/L (ref 9–46)
AST: 15 U/L (ref 10–40)
Albumin: 4.7 g/dL (ref 3.6–5.1)
Alkaline phosphatase (APISO): 41 U/L (ref 36–130)
BUN: 13 mg/dL (ref 7–25)
CO2: 29 mmol/L (ref 20–32)
Calcium: 9.9 mg/dL (ref 8.6–10.3)
Chloride: 101 mmol/L (ref 98–110)
Creat: 1.09 mg/dL (ref 0.60–1.26)
Globulin: 2.3 g/dL (ref 1.9–3.7)
Glucose, Bld: 98 mg/dL (ref 65–99)
Potassium: 4.3 mmol/L (ref 3.5–5.3)
Sodium: 138 mmol/L (ref 135–146)
Total Bilirubin: 1.7 mg/dL — ABNORMAL HIGH (ref 0.2–1.2)
Total Protein: 7 g/dL (ref 6.1–8.1)

## 2023-08-14 LAB — CBC WITH DIFFERENTIAL/PLATELET
Absolute Lymphocytes: 1506 {cells}/uL (ref 850–3900)
Absolute Monocytes: 487 {cells}/uL (ref 200–950)
Basophils Absolute: 39 {cells}/uL (ref 0–200)
Basophils Relative: 0.7 %
Eosinophils Absolute: 258 {cells}/uL (ref 15–500)
Eosinophils Relative: 4.6 %
HCT: 45.7 % (ref 38.5–50.0)
Hemoglobin: 15.8 g/dL (ref 13.2–17.1)
MCH: 29.7 pg (ref 27.0–33.0)
MCHC: 34.6 g/dL (ref 32.0–36.0)
MCV: 85.9 fL (ref 80.0–100.0)
MPV: 10.3 fL (ref 7.5–12.5)
Monocytes Relative: 8.7 %
Neutro Abs: 3310 {cells}/uL (ref 1500–7800)
Neutrophils Relative %: 59.1 %
Platelets: 250 10*3/uL (ref 140–400)
RBC: 5.32 10*6/uL (ref 4.20–5.80)
RDW: 12.6 % (ref 11.0–15.0)
Total Lymphocyte: 26.9 %
WBC: 5.6 10*3/uL (ref 3.8–10.8)

## 2024-02-09 ENCOUNTER — Other Ambulatory Visit: Payer: Self-pay | Admitting: Family Medicine

## 2024-02-09 MED ORDER — LISDEXAMFETAMINE DIMESYLATE 50 MG PO CAPS: 50.0000 mg | ORAL_CAPSULE | Freq: Every day | ORAL | 0 refills | Status: DC

## 2024-02-12 ENCOUNTER — Ambulatory Visit: Admitting: Family Medicine

## 2024-02-12 ENCOUNTER — Encounter: Payer: Self-pay | Admitting: Family Medicine

## 2024-02-12 VITALS — BP 146/98 | HR 71 | Temp 98.2°F | Ht 70.5 in | Wt 143.4 lb

## 2024-02-12 DIAGNOSIS — F900 Attention-deficit hyperactivity disorder, predominantly inattentive type: Secondary | ICD-10-CM

## 2024-02-12 MED ORDER — HYDROXYZINE HCL 25 MG PO TABS: 25.0000 mg | ORAL_TABLET | Freq: Every evening | ORAL | 3 refills | Status: DC | PRN

## 2024-02-12 NOTE — Progress Notes (Signed)
 Subjective:    Patient ID: Kurt Bishop, male    DOB: 31-Jul-1985, 38 y.o.   MRN: [id number] Patient is a very pleasant 38 year old Caucasian gentleman with a history of ADD.  He is currently on Vyvanse .  He states that the medication works well for him.  Patient does report insomnia.  He states that it is hard for him to stay asleep.  However he attributes this to stress.  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  He has been working 80 to 100 hours a week.  As a result, he often finds himself having a difficult time staying asleep due to stress and worry.  He used to take trazodone  but he stopped the medication because it left him feeling hung over the next day.  His blood pressure today is elevated.  I verified this myself and found to be 142/92.  He has never had high blood pressure in the past.  However he states that he has been anxious and frustrated and stressed out at work recently.  His wife is a Engineer, civil (consulting) and he states that she can monitor his blood pressure at home.  Otherwise the Vyvanse  is working well for him.  He states that without the medication he cannot complete his job as a Merchandiser, retail.  He states that the medication allows him to focus, and keeps him from making careless errors, allows him to finish his assigned task without becoming easily distracted. Past Medical History:  Diagnosis Date   ADD (attention deficit disorder)    Complication of anesthesia    woke up slow & also reports that he passed out 3 times after getting home post hernia repair     Family history of adverse reaction to anesthesia    passing out & nausea & vomiting - Mother    Headache    uses excedrin 4-5 times per yr.    Seizure-like activity (HCC)    associated with the vasovagal response, seeing blood, smashing thumb at work with a hammer     Syncope, vasovagal 09/2014   passed out post op & again at work & taken to [facility] in [place]   Current Outpatient Medications on File Prior to  Visit  Medication Sig Dispense Refill   lisdexamfetamine (VYVANSE ) 50 MG capsule Take 1 capsule (50 mg total) by mouth daily. 90 capsule 0   lisdexamfetamine (VYVANSE ) 50 MG capsule Take 1 capsule (50 mg total) by mouth daily. (Patient not taking: Reported on 02/12/2024) 90 capsule 0   lisdexamfetamine (VYVANSE ) 50 MG capsule Take 1 capsule (50 mg total) by mouth daily. (Patient not taking: Reported on 02/12/2024) 90 capsule 0   No current facility-administered medications on file prior to visit.   Allergies  Allergen Reactions   Percocet Frost.Gainer ] Nausea And Vomiting   Social History   Socioeconomic History   Marital status: Married    Spouse name: Not on file   Number of children: Not on file   Years of education: Not on file   Highest education level: 12th grade  Occupational History   Not on file  Tobacco Use   Smoking status: Former    Types: E-cigarettes    Quit date: 06/19/2012    Years since quitting: 11.6   Smokeless tobacco: Former    Quit date: 06/18/2012  Vaping Use   Vaping status: Never Used  Substance and Sexual Activity   Alcohol use: Yes    Comment: beer every now and then   Drug  use: No   Sexual activity: Not on file  Other Topics Concern   Not on file  Social History Narrative   Not on file   Social Drivers of Health   Financial Resource Strain: Low Risk  (02/05/2024)   Overall Financial Resource Strain (CARDIA)    Difficulty of Paying Living Expenses: Not hard at all  Food Insecurity: No Food Insecurity (02/05/2024)   Hunger Vital Sign    Worried About Running Out of Food in the Last Year: Never true    Ran Out of Food in the Last Year: Never true  Transportation Needs: No Transportation Needs (02/05/2024)   PRAPARE - Administrator, Civil Service (Medical): No    Lack of Transportation (Non-Medical): No  Physical Activity: Insufficiently Active (02/05/2024)   Exercise Vital Sign    Days of Exercise per Week: 4 days     Minutes of Exercise per Session: 30 min  Stress: Stress Concern Present (02/05/2024)   [employer] of Occupational Health - Occupational Stress Questionnaire    Feeling of Stress: To some extent  Social Connections: Socially Isolated (02/05/2024)   Social Connection and Isolation Panel    Frequency of Communication with Friends and Family: Once a week    Frequency of Social Gatherings with Friends and Family: Once a week    Attends Religious Services: Never    Database administrator or Organizations: No    Attends Engineer, structural: Not on file    Marital Status: Married  Intimate Partner Violence: Unknown (08/19/2021)   Received from [facility]   HITS    Physically Hurt: Not on file    Insult or Talk Down To: Not on file    Threaten Physical Harm: Not on file    Scream or Curse: Not on file     Review of Systems  All other systems reviewed and are negative.      Objective:   Physical Exam Vitals reviewed.  Constitutional:      Appearance: He is well-developed.  HENT:     Mouth/Throat:     Dentition: Abnormal dentition. Dental caries present.  Neck:     Vascular: No JVD.  Cardiovascular:     Rate and Rhythm: Normal rate and regular rhythm.     Heart sounds: Normal heart sounds. No murmur heard. Pulmonary:     Effort: Pulmonary effort is normal. No respiratory distress.     Breath sounds: Normal breath sounds. No wheezing or rales.  Abdominal:     General: Bowel sounds are normal. There is no distension.     Palpations: Abdomen is soft.     Tenderness: There is no abdominal tenderness. There is no guarding or rebound.  Musculoskeletal:     Cervical back: Neck supple.  Lymphadenopathy:     Cervical: No cervical adenopathy.           Assessment & Plan:  Attention deficit hyperactivity disorder (ADHD), predominantly inattentive type Continue Vyvanse  50 mg daily.  I am concerned about his blood pressure.  I believe that this is likely stress  related.  I am asking him to allow his wife to monitor his blood pressure at home over the next week.  If consistently greater than 140/90, we may try decreasing his dose of Vyvanse  or if necessary adding blood pressure medication.  Recommended avoiding caffeine and sodium.  I did give the patient hydroxyzine  25 mg to take at night if needed for insomnia.  However I  believe most of the issues he is dealing with right now are stress related

## 2024-05-12 ENCOUNTER — Encounter: Payer: Self-pay | Admitting: Family Medicine

## 2024-05-13 ENCOUNTER — Other Ambulatory Visit: Payer: Self-pay

## 2024-05-13 MED ORDER — LISDEXAMFETAMINE DIMESYLATE 50 MG PO CAPS: 50.0000 mg | ORAL_CAPSULE | Freq: Every day | ORAL | 0 refills | Status: AC

## 2024-05-20 ENCOUNTER — Other Ambulatory Visit: Payer: Self-pay

## 2024-05-20 MED ORDER — HYDROXYZINE HCL 25 MG PO TABS: 25.0000 mg | ORAL_TABLET | Freq: Every evening | ORAL | 3 refills | Status: AC | PRN

## 2024-08-15 ENCOUNTER — Encounter: Admitting: Family Medicine
